# Patient Record
Sex: Male | Born: 1975 | Race: White | Hispanic: No | Smoking: Never smoker
Health system: Southern US, Community
[De-identification: ages and names within clinical notes are randomized; demographics above are authoritative.]

## PROBLEM LIST (undated history)

## (undated) DIAGNOSIS — G8929 Other chronic pain: Secondary | ICD-10-CM

## (undated) DIAGNOSIS — F909 Attention-deficit hyperactivity disorder, unspecified type: Secondary | ICD-10-CM

## (undated) DIAGNOSIS — I10 Essential (primary) hypertension: Secondary | ICD-10-CM

## (undated) DIAGNOSIS — M5030 Other cervical disc degeneration, unspecified cervical region: Secondary | ICD-10-CM

## (undated) HISTORY — PX: ANKLE SURGERY: SHX546

## (undated) HISTORY — PX: EYE SURGERY: SHX253

## (undated) HISTORY — PX: SPINAL CORD STIMULATOR INSERTION: SHX5378

## (undated) HISTORY — PX: FRACTURE SURGERY: SHX138

## (undated) HISTORY — PX: SPINAL CORD STIMULATOR REMOVAL: SHX2423

---

## 2008-04-18 ENCOUNTER — Emergency Department: Payer: Self-pay | Admitting: Emergency Medicine

## 2008-12-10 ENCOUNTER — Encounter: Payer: Self-pay | Admitting: Family Medicine

## 2009-05-06 ENCOUNTER — Emergency Department: Payer: Self-pay | Admitting: Internal Medicine

## 2012-04-24 ENCOUNTER — Emergency Department: Payer: Self-pay | Admitting: Emergency Medicine

## 2014-04-22 ENCOUNTER — Emergency Department: Payer: Self-pay | Admitting: Emergency Medicine

## 2015-04-20 ENCOUNTER — Emergency Department
Admission: EM | Admit: 2015-04-20 | Discharge: 2015-04-20 | Disposition: A | Discharge status: Home / Self Care | Payer: Self-pay | Attending: Emergency Medicine | Admitting: Emergency Medicine

## 2015-04-20 ENCOUNTER — Encounter: Payer: Self-pay | Admitting: Family Medicine

## 2015-04-20 DIAGNOSIS — J039 Acute tonsillitis, unspecified: Secondary | ICD-10-CM

## 2015-04-20 DIAGNOSIS — Z792 Long term (current) use of antibiotics: Secondary | ICD-10-CM | POA: Insufficient documentation

## 2015-04-20 LAB — POCT RAPID STREP A: Streptococcus, Group A Screen (Direct): NEGATIVE

## 2015-04-20 MED ORDER — AMOXICILLIN-POT CLAVULANATE 875-125 MG PO TABS: 1.0000 | ORAL_TABLET | Freq: Two times a day (BID) | ORAL | Status: DC

## 2015-04-20 NOTE — ED Provider Notes (Signed)
[facility] Emergency Department  ____________________________________________  Time seen: Approximately 11:06 AM  I have reviewed the triage vital signs and the nursing notes.   HISTORY  Chief Complaint No chief complaint on file.    HPI Timothy Stallionnthoney R Sobolewski Sr. is a 39 y.o. male with a 7 day history of sore throat. He states that just prior to onset of symptoms, he was working with insulation. He states he was wearing a mask, but doesn't feel it worked very well. He states he had a fever last night. Sore throat was generalized when it started, but is now mainly on the left. He denies difficulty breathing, swallowing, or speaking.   History reviewed. No pertinent past medical history.  There are no active problems to display for this patient.   No past surgical history on file.  Current Outpatient Rx  Name  Route  Sig  Dispense  Refill  . amoxicillin-clavulanate (AUGMENTIN) 875-125 MG per tablet   Oral   Take 1 tablet by mouth 2 (two) times daily.   20 tablet   0     Allergies Review of patient's allergies indicates no known allergies.  History reviewed. No pertinent family history.  Social History History  Substance Use Topics  . Smoking status: Not on file  . Smokeless tobacco: Not on file  . Alcohol Use: Not on file    Review of Systems Constitutional: No fever/chills Eyes: No visual changes. ENT: Sore throat.yes, Difficulty Swallowing no Respiratory: Denies shortness of breath. Gastrointestinal: No abdominal pain.  No nausea, no vomiting.  No diarrhea. Genitourinary: Negative for dysuria. Musculoskeletal: no for generalized body aches. Skin: no for rash. Neurological: Negative for headaches, focal weakness or numbness.  10-point ROS otherwise negative.  ____________________________________________   PHYSICAL EXAM:  VITAL SIGNS: ED Triage Vitals  Enc Vitals Group     BP --      Pulse --      Resp --      Temp --      Temp  src --      SpO2 --      Weight --      Height --      Head Cir --      Peak Flow --      Pain Score --      Pain Loc --      Pain Edu? --      Excl. in GC? --     Constitutional: Alert and oriented. Well appearing and in no acute distress. Eyes: Conjunctivae are normal. PERRL. EOMI. Head: Atraumatic. Nose: No congestion/rhinnorhea. Mouth/Throat: Mucous membranes are moist.  Oropharynx erythematous. No uvular shift. Exudate present on left tonsil. Neck: No stridor.  Lymphatic: no lymphadenopathy. Cardiovascular: Normal rate, regular rhythm. Good peripheral circulation. Respiratory: Normal respiratory effort. Lungs CTAB. Gastrointestinal: Soft and nontender. Musculoskeletal: No lower extremity tenderness nor edema.   Neurologic:  Normal speech and language. No gross focal neurologic deficits are appreciated. Speech is normal. No gait instability. Skin:  Skin is warm, dry and intact. No rash noted Psychiatric: Mood and affect are normal. Speech and behavior are normal.  ____________________________________________   LABS (all labs ordered are listed, but only abnormal results are displayed)  Labs Reviewed  POCT RAPID STREP A ([facility] CARE ONLY)  POCT RAPID STREP A ([facility] CARE ONLY)  Negative for strep. ____________________________________________  EKG  ____________________________________________  RADIOLOGY  ____________________________________________   PROCEDURES  Procedure(s) performed: None  Critical Care performed: No  ____________________________________________   INITIAL IMPRESSION / ASSESSMENT AND PLAN / ED COURSE  Pertinent labs & imaging results that were available during my care of the patient were reviewed by me and considered in my medical decision making (see chart for details).  Rapid strep is negative. Will send for culture. Will prescribe Augmentin due to unilateral pain after 1 week. No evidence of peritonsillar abscess today. Return  precautions advised. Will have follow up with ENT for symptoms that are not improving with medication and time. ____________________________________________   FINAL CLINICAL IMPRESSION(S) / ED DIAGNOSES  Final diagnoses:  Tonsillitis with exudate     Chinita PesterCari B Ami Thornsberry, FNP 04/20/15 1137  Chinita PesterCari B Rody Keadle, FNP 04/20/15 1222  Jene Everyobert Kinner, MD 04/20/15 1525

## 2015-04-20 NOTE — ED Notes (Signed)
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  No resp distress or difficulty talking or swallowing.  Skin w/d

## 2015-04-20 NOTE — Discharge Instructions (Signed)

## 2015-04-22 LAB — CULTURE, GROUP A STREP (THRC)

## 2015-06-21 ENCOUNTER — Emergency Department
Admission: EM | Admit: 2015-06-21 | Discharge: 2015-06-21 | Disposition: A | Discharge status: Home / Self Care | Payer: Self-pay | Attending: Emergency Medicine | Admitting: Emergency Medicine

## 2015-06-21 ENCOUNTER — Emergency Department: Payer: Self-pay

## 2015-06-21 ENCOUNTER — Encounter: Payer: Self-pay | Admitting: Emergency Medicine

## 2015-06-21 DIAGNOSIS — Y998 Other external cause status: Secondary | ICD-10-CM | POA: Insufficient documentation

## 2015-06-21 DIAGNOSIS — S92902A Unspecified fracture of left foot, initial encounter for closed fracture: Secondary | ICD-10-CM

## 2015-06-21 DIAGNOSIS — X58XXXA Exposure to other specified factors, initial encounter: Secondary | ICD-10-CM | POA: Insufficient documentation

## 2015-06-21 DIAGNOSIS — S92212A Displaced fracture of cuboid bone of left foot, initial encounter for closed fracture: Secondary | ICD-10-CM | POA: Insufficient documentation

## 2015-06-21 DIAGNOSIS — Y9289 Other specified places as the place of occurrence of the external cause: Secondary | ICD-10-CM | POA: Insufficient documentation

## 2015-06-21 DIAGNOSIS — Y9389 Activity, other specified: Secondary | ICD-10-CM | POA: Insufficient documentation

## 2015-06-21 MED ORDER — OXYCODONE-ACETAMINOPHEN 5-325 MG PO TABS: 1.0000 | ORAL_TABLET | Freq: Three times a day (TID) | ORAL | Status: DC | PRN

## 2015-06-21 MED ORDER — IBUPROFEN 800 MG PO TABS: 800.0000 mg | ORAL_TABLET | Freq: Three times a day (TID) | ORAL | Status: AC | PRN

## 2015-06-21 NOTE — ED Provider Notes (Signed)
[facility] Emergency Department Provider Note  ____________________________________________  Time seen: Approximately 5:35 PM  I have reviewed the triage vital signs and the nursing notes.   HISTORY  Chief Complaint Ankle Pain   HPI Timothy Stallionnthoney R Moreland Sr. is a 39 y.o. male presents to the ER for complaints of left foot pain. Patient reports that last night he went to stand up and get out of his chair and reports he rolled his left ankle. Patient states that he has had pain in his left foot since. Denies fall or other injury. Denies head injury or loss consciousness.  Patient reports that he had some pain last night but got better after he walked on it. Patient reports that the pain was then worse this morning when he woke up as it had swollen and was bruised. Denies pain radiation. Denies numbness or tingling sensation. Patient states that pain is to the left lateral foot and in the arch area. States the pain is currently 7 out of 10.   No past medical history on file.  There are no active problems to display for this patient.   No past surgical history on file.  Current Outpatient Rx  Name  Route  Sig  Dispense  Refill               Allergies Review of patient's allergies indicates no known allergies.  History reviewed. No pertinent family history.  Social History History  Substance Use Topics  . Smoking status: Never Smoker   . Smokeless tobacco: Not on file  . Alcohol Use: Not on file    Review of Systems Constitutional: No fever/chills Eyes: No visual changes. ENT: No sore throat. Cardiovascular: Denies chest pain. Respiratory: Denies shortness of breath. Gastrointestinal: No abdominal pain.  No nausea, no vomiting.  No diarrhea.  No constipation. Genitourinary: Negative for dysuria. Musculoskeletal: Negative for back pain. Left foot pain.  Skin: Negative for rash. Neurological: Negative for headaches, focal weakness or  numbness.  10-point ROS otherwise negative.  ____________________________________________   PHYSICAL EXAM:  VITAL SIGNS: ED Triage Vitals  Enc Vitals Group     BP 06/21/15 1733 164/89 mmHg     Pulse Rate 06/21/15 1733 89     Resp 06/21/15 1733 16     Temp 06/21/15 1733 98.3 F (36.8 C)     Temp src --      SpO2 06/21/15 1733 97 %     Weight --      Height --      Head Cir --      Peak Flow --      Pain Score 06/21/15 1728 10     Pain Loc --      Pain Edu? --      Excl. in GC? --     Constitutional: Alert and oriented. Well appearing and in no acute distress. Eyes: Conjunctivae are normal. PERRL. EOMI. Head: Atraumatic. Nose: No congestion/rhinnorhea. Mouth/Throat: Mucous membranes are moist.  Oropharynx non-erythematous. Neck: No stridor.  No cervical spine tenderness to palpation. Hematological/Lymphatic/Immunilogical: No cervical lymphadenopathy. Cardiovascular: Normal rate, regular rhythm. Grossly normal heart sounds.  Good peripheral circulation. Respiratory: Normal respiratory effort.  No retractions. Lungs CTAB. Gastrointestinal: Soft and nontender. No distention. No abdominal bruits. No CVA tenderness. Musculoskeletal: No lower or upper extremity tenderness nor edema.  No joint effusions. No cervical, thoracic or lumbar tenderness to palpation. Except: Left lateral and left medial foot moderate tender to palpation with mild to moderate ecchymosis and  mild swelling. Pain with rotation and dorsiflexion. Bilateral pedal pulses equal and easily palpated. No motor or sensation deficit. Neurologic:  Normal speech and language. No gross focal neurologic deficits are appreciated. No gait instability. Skin:  Skin is warm, dry and intact. No rash noted. Psychiatric: Mood and affect are normal. Speech and behavior are normal.  ____________________________________________   LABS (all labs ordered are listed, but only abnormal results are displayed)  Labs Reviewed - No  data to display  RADIOLOGY  I, Renford Dills, personally viewed and evaluated these images as part of my medical decision making.   EXAM: LEFT FOOT - COMPLETE 3+ VIEW  COMPARISON: LEFT ankle radiographs 04/22/2014  FINDINGS: Osseous mineralization normal.  Joint spaces preserved.  No fracture, dislocation, or bone destruction.  IMPRESSION: Normal exam.   Electronically Signed By: Ulyses Southward M.D. On: 06/21/2015 18:16      Xray reviewed, small bony fragment adjacent to left cuboid bone, suspect avulsion fracture left cuboid.   ____________________________________________   PROCEDURES  Procedure(s) performed:  SPLINT APPLICATION Date/Time: 7:00 PM Authorized by: Renford Dills Consent: Verbal consent obtained. Risks and benefits: risks, benefits and alternatives were discussed Consent given by: patient Splint applied by:ed technician Location details: left foot Splint type: posterior splint Supplies used: ocl and crutches Post-procedure: The splinted body part was neurovascularly unchanged following the procedure. Patient tolerance: Patient tolerated the procedure well with no immediate complications.     _____________________   INITIAL IMPRESSION / ASSESSMENT AND PLAN / ED COURSE  Pertinent labs & imaging results that were available during my care of the patient were reviewed by me and considered in my medical decision making (see chart for details).  Very well appearing. No acute distress. Presents to ER for left foot pain post accidentally rolling left ankle last night. Denies other injuries. Left foot small avulsion fracture of cuboid. Will splint, prn ibuprofen and percocet. Follow up with orthopedic this week. Discussed follow and return parameters. Patient agreed to plan.   ____________________________________________   FINAL CLINICAL IMPRESSION(S) / ED DIAGNOSES  Final diagnoses:  Foot fracture, left, closed, initial encounter  Cuboid  fracture, left, closed, initial encounter      Renford Dills, NP 06/21/15 1900  Sharman Cheek, MD 06/21/15 2104

## 2015-06-21 NOTE — Discharge Instructions (Signed)
Take medication as prescribed. Apply ice. Keep in splint and use crutches. No weightbearing.  Follow-up with orthopedics this week. See above to call tomorrow to schedule follow-up. Return to the ER for new or worsening concerns.   Left foot Fracture A fracture is a break in a bone, due to a force on the bone that is greater than the bone's strength can handle. There are many types of fractures, including:  Complete fracture: The break passes completely through the bone.  Displaced: The ends of the bone fragments are not properly aligned.  Non-displaced: The ends of the bone fragments are in proper alignment.  Incomplete fracture (greenstick): The break does not pass completely through the bone. Incomplete fractures may or may not be angular (angulated).  Open fracture (compound): Part of the broken bone pokes through the skin. Open fractures have a high risk for infection.  Closed fracture: The fracture has not broken through the skin.  Comminuted fracture: The bone is broken into more than two pieces.  Compression fracture: The break occurs from extreme pressure on the bone (includes crushing injury).  Impacted fracture: The broken bone ends have been driven into each other.  Avulsion fracture: A ligament or tendon pulls a small piece of bone off from the main bony segment.  Pathologic fracture: A fracture due to the bone being made weak by a disease (osteoporosis or tumors).  Stress fracture: A fracture caused by intense exercise or repetitive and prolonged pressure that makes the bone weak. SYMPTOMS   Pain, tenderness, bleeding, bruising, and swelling at the fracture site.  Weakness and inability to bear weight on the injured extremity.  Paleness and deformity (sometimes).  Loss of pulse, numbness, tingling, or paralysis below the fracture site (usually a limb); these are emergencies. CAUSES  Bone being subjected to a force greater than its strength. RISK INCREASES  WITH:  Contact sports and falls from heights.  Previous or current bone problems (osteoporosis or tumors).  Poor balance.  Poor strength and flexibility. PREVENTION   Warm up and stretch properly before activity.  Maintain physical fitness:  Cardiovascular fitness.  Muscle strength.  Flexibility and endurance.  Wear proper protective equipment.  Use proper exercise technique. RELATED COMPLICATIONS   Bone fails to heal (nonunion).  Bone heals in a poor position (malunion).  Low blood volume (hypovolemic), shock due to blood loss.  Clump of fat cells travels through the blood (fat embolus) from the injury site to the lungs or brain (more common with thigh fractures).  Obstruction of nearby arteries. TREATMENT  Treatment first requires realigning of the bones (reduction) by a medically trained person, if the fracture is displaced. After realignment if the fracture is completed, or for non-displaced fractures, ice and medicine are used to reduce pain and inflammation. The bone and adjacent joints are then restrained with a splint, cast, or brace to allow the bones to heal without moving. Surgery is sometimes needed, to reposition the bones and hold the position with rods, pins, plates, or screws. Restraint for long periods of time may result in muscle and joint weakness or build up of fluid in tissues (edema). For this reason, physical therapy is often needed to regain strength and full range of motion. Recovery is complete when there is no bone motion at the fracture site and x-rays (radiographs) show complete healing.  MEDICATION   General anesthesia, sedation, or muscle relaxants may be needed to allow for realignment of the fracture. If pain medicine is needed, nonsteroidal anti-inflammatory medicines (  aspirin and ibuprofen), or other minor pain relievers (acetaminophen), are often advised.  Do not take pain medicine for 7 days before surgery.  Stronger pain relievers may  be prescribed by your caregiver. Use only as directed and only as much as you need. SEEK MEDICAL CARE IF:   The following occur after restraint or surgery. (Report any of these signs immediately):  Swelling above or below the fracture site.  Severe, persistent pain.  Blue or gray skin below the fracture site, especially under the nails. Numbness or loss of feeling below the fracture site. Document Released: 11/21/2005 Document Revised: 11/07/2012 Document Reviewed: 03/05/2009 [facility] Patient Information 2015 [employer] [place]. This information is not intended to replace advice given to you by your health care provider. Make sure you discuss any questions you have with your health care provider.

## 2015-06-21 NOTE — ED Notes (Signed)
Pt injured left ankle last night after fall. Having swelling to area.

## 2015-06-21 NOTE — ED Notes (Signed)
Left Ankle elevated on pillow and ice pack applied.

## 2016-03-03 ENCOUNTER — Ambulatory Visit (INDEPENDENT_AMBULATORY_CARE_PROVIDER_SITE_OTHER): Payer: Medicaid Other | Admitting: Podiatry

## 2016-03-03 ENCOUNTER — Telehealth: Payer: Self-pay | Admitting: *Deleted

## 2016-03-03 ENCOUNTER — Encounter: Payer: Self-pay | Admitting: Podiatry

## 2016-03-03 ENCOUNTER — Ambulatory Visit (INDEPENDENT_AMBULATORY_CARE_PROVIDER_SITE_OTHER): Payer: Medicaid Other

## 2016-03-03 VITALS — BP 137/85 | HR 108 | Resp 18

## 2016-03-03 DIAGNOSIS — S93492D Sprain of other ligament of left ankle, subsequent encounter: Secondary | ICD-10-CM | POA: Diagnosis not present

## 2016-03-03 DIAGNOSIS — R52 Pain, unspecified: Secondary | ICD-10-CM

## 2016-03-03 DIAGNOSIS — T148 Other injury of unspecified body region: Secondary | ICD-10-CM | POA: Diagnosis not present

## 2016-03-03 DIAGNOSIS — T148XXA Other injury of unspecified body region, initial encounter: Secondary | ICD-10-CM

## 2016-03-03 DIAGNOSIS — S93402S Sprain of unspecified ligament of left ankle, sequela: Secondary | ICD-10-CM

## 2016-03-03 NOTE — Progress Notes (Signed)
   Subjective:    Patient ID: Timothy StallionAnthoney R Dross Sr., male    DOB: Jul 19, 1976, 40 y.o.   MRN: [id number]  HPI  40 year old male presents the office of consent a left ankle pain which is been ongoing for approximately 6-7 months. He said when the pain started he had a injury to his ankle which he rolled his ankle. At that time to the emergency room he was told he had a fracture. He has been following up in the clinic and he has had steroid injections in the area but any relief. He has also had braces without any relief. He feels it is not coming from the bone his morbid tendon or ligament his ankle. He states he has not had pain when he gets up in the morning however after being on his feet for quite some time he gets pain. No tingling or numbness. Other complaints.   Review of Systems  All other systems reviewed and are negative.      Objective:   Physical Exam General: AAO x3, NAD  Dermatological: Skin is warm, dry and supple bilateral. Nails x 10 are well manicured; remaining integument appears unremarkable at this time. There are no open sores, no preulcerative lesions, no rash or signs of infection present.  Vascular: Dorsalis Pedis artery and Posterior Tibial artery pedal pulses are 2/4 bilateral with immedate capillary fill time. Pedal hair growth present. No varicosities and no lower extremity edema present bilateral. There is no pain with calf compression, swelling, warmth, erythema.   Neruologic: Grossly intact via light touch bilateral. Vibratory intact via tuning fork bilateral. Protective threshold with Semmes Wienstein monofilament intact to all pedal sites bilateral. Patellar and Achilles deep tendon reflexes 2+ bilateral. No Babinski or clonus noted bilateral.   Musculoskeletal: There is tenderness palpation of the course the ATFL the left ankle. There is no palmar CFL or PTFL. There is no pain the other ankle ligament. Ankle, subtalar joint range of motion is intact. There is an  increase in anterior drawer test compared to contralateral extremity. No area pinpoint bony tenderness or pain the vibratory sensation. There is localized edema to the anterolateral aspect of the ankle. MMT 5/5.  Gait: Unassisted, Nonantalgic.       Assessment & Plan:   40 year old male left ankle pain, likely ATFL sprain/tear.  -Treatment options discussed including all alternatives, risks, and complications -X-rays were obtained and reviewed with the patient. There is no evidence of acute fracture stress fracture identified this time.  -At this time to think any pain and given clinical symptoms I do recommend an MRI of the left ankle to evaluate for ankle instability, tear. This is for surgical planning.  -Continue ankle brace for now.  -Follow-up after MRI.   Ovid CurdMatthew Wagoner, DPM

## 2016-03-03 NOTE — Telephone Encounter (Addendum)
-----   Message from Vivi BarrackMatthew R Wagoner, DPM sent at 03/03/2016  9:45 AM EDT ----- Can you please order an MRI of the left ankle to rule out ATFL tear; history of ankle sprain and inversion ankle injury. [facility]  03/04/2016-[facility] requires more information, faxed to [phone] Case# [id number], orders, pt demographics and clinicals.  03/07/2016-[facility] PRIOR AUTHORIZED FOR MRI LEFT ANKLE #[id number], VALID 03/04/2016 TO 04/03/2016. [facility]. 03/09/2016-faxed request for copy of MRI left ankle disc.  Left message informing pt of the delay in results for MRI to be sent to overread service, and we would call once results were reviewed by Dr. Ardelle AntonWagoner. Pt called and states he wanted to make sure what my message meant and I explained that Dr. Ardelle AntonWagoner wanted more detailed review of the MRI image and we would call with instruction to continue the prescribed therapies and I would call with results once the overread had been reviewed. 03/11/2016-Mailed MRI copy disc to [facility].

## 2016-03-08 ENCOUNTER — Ambulatory Visit
Admission: RE | Admit: 2016-03-08 | Discharge: 2016-03-08 | Disposition: A | Discharge status: Home / Self Care | Payer: Medicaid Other | Source: Ambulatory Visit | Attending: Podiatry | Admitting: Podiatry

## 2016-03-08 DIAGNOSIS — R52 Pain, unspecified: Secondary | ICD-10-CM | POA: Insufficient documentation

## 2016-03-08 DIAGNOSIS — T148XXA Other injury of unspecified body region, initial encounter: Secondary | ICD-10-CM

## 2016-03-08 DIAGNOSIS — T148 Other injury of unspecified body region: Secondary | ICD-10-CM | POA: Insufficient documentation

## 2016-03-08 DIAGNOSIS — S93402S Sprain of unspecified ligament of left ankle, sequela: Secondary | ICD-10-CM | POA: Diagnosis present

## 2016-03-08 DIAGNOSIS — M25472 Effusion, left ankle: Secondary | ICD-10-CM | POA: Diagnosis not present

## 2016-03-08 DIAGNOSIS — M24272 Disorder of ligament, left ankle: Secondary | ICD-10-CM | POA: Diagnosis not present

## 2016-03-09 NOTE — Telephone Encounter (Signed)
-----   Message from Vivi BarrackMatthew R Wagoner, DPM sent at 03/08/2016  5:05 PM EDT ----- Can you please send his MRI out for an over read to look at the ATFL and the lateral ankle ligaments.

## 2016-03-15 ENCOUNTER — Encounter: Payer: Self-pay | Admitting: Podiatry

## 2016-03-15 ENCOUNTER — Ambulatory Visit (INDEPENDENT_AMBULATORY_CARE_PROVIDER_SITE_OTHER): Payer: Medicaid Other | Admitting: Podiatry

## 2016-03-15 VITALS — BP 116/71 | HR 65 | Resp 18

## 2016-03-15 DIAGNOSIS — T148 Other injury of unspecified body region: Secondary | ICD-10-CM | POA: Diagnosis not present

## 2016-03-15 DIAGNOSIS — T148XXA Other injury of unspecified body region, initial encounter: Secondary | ICD-10-CM | POA: Insufficient documentation

## 2016-03-15 DIAGNOSIS — S93402S Sprain of unspecified ligament of left ankle, sequela: Secondary | ICD-10-CM

## 2016-03-15 MED ORDER — OXYCODONE-ACETAMINOPHEN 5-325 MG PO TABS: 1.0000 | ORAL_TABLET | Freq: Three times a day (TID) | ORAL | Status: DC | PRN

## 2016-03-15 NOTE — Progress Notes (Signed)
Patient ID: Timothy StallionAnthoney R Casanova Sr., male   DOB: [birth year], 40 y.o.   MRN: [id number]  Subjective: 40 year old male presents the office for follow-up evaluation of left ankle pain. He states he is tried with a brace although it throbs and causes discomfort. Otherwise his pain is about the same. He presents to discuss MRI results. No acute changes his last appointment and no other complaints at this time.  Objective: General: AAO x3, NAD  Dermatological: Skin is warm, dry and supple bilateral. Nails x 10 are well manicured; remaining integument appears unremarkable at this time. There are no open sores, no preulcerative lesions, no rash or signs of infection present.  Vascular: Dorsalis Pedis artery and Posterior Tibial artery pedal pulses are 2/4 bilateral with immedate capillary fill time. Pedal hair growth present.  There is no pain with calf compression, swelling, warmth, erythema.   Neruologic: Grossly intact via light touch bilateral. Vibratory intact via tuning fork bilateral. Protective threshold with Semmes Wienstein monofilament intact to all pedal sites bilateral. Patellar and Achilles deep tendon reflexes 2+ bilateral. No Babinski or clonus noted bilateral.   Musculoskeletal: There is tenderness palpation of the course the ATFL the left ankle. There is no pain along the CFL or PTFL. There is no pain the other ankle ligament. Ankle, subtalar joint range of motion is intact. There is an increase in anterior drawer test compared to contralateral extremity. No area pinpoint bony tenderness or pain the vibratory sensation. There is localized edema to the anterolateral aspect of the ankle. MMT 5/5. Overall exam appears to be unchanged.  Gait: Unassisted, Nonantalgic.   Assessment:  40 year old male left ankle pain, likely ATFL sprain/tear.  Plan:  -Treatment options discussed including all alternatives, risks, and complications -MRI results were discussed the patient. Ice and his MRI for  second opinion. -Dispensed anklet to help with swelling as well as for some stability. -Ice and elevation -Prescribed Percocet. He states he has had like and previously which he did not do well with. He said Percocet previously without any problems. The nausea is experiencing previously was needed to not take pain medicine with food. -We will call him with results of the MRI second opinion and for appointment at that time.   IMPRESSION: 1. The calcaneofibular ligament is slightly indistinct which could be result from a prior sprain, but is not thought to be overtly discontinuous. Talofibular ligaments appear intact. I do not see discontinuity of the anterior inferior tibiofibular ligaments to suggest syndesmotic injury. 2. Distal tibialis posterior tendinopathy, correlate clinically in assessing for tibialis posterior dysfunction. 3. Very subtle edema signal along the anterior margin of the distal Achilles tendon, but without tendinopathy or overt preAchilles bursitis.  Ovid CurdMatthew Benaiah Behan, DPM

## 2016-03-29 ENCOUNTER — Encounter: Payer: Self-pay | Admitting: Podiatry

## 2016-03-29 ENCOUNTER — Ambulatory Visit (INDEPENDENT_AMBULATORY_CARE_PROVIDER_SITE_OTHER): Payer: Medicaid Other | Admitting: Podiatry

## 2016-03-29 VITALS — BP 118/75 | HR 76 | Resp 18

## 2016-03-29 DIAGNOSIS — T148XXA Other injury of unspecified body region, initial encounter: Secondary | ICD-10-CM

## 2016-03-29 DIAGNOSIS — T148 Other injury of unspecified body region: Secondary | ICD-10-CM

## 2016-03-29 DIAGNOSIS — S93492D Sprain of other ligament of left ankle, subsequent encounter: Secondary | ICD-10-CM | POA: Diagnosis not present

## 2016-03-29 DIAGNOSIS — S93402S Sprain of unspecified ligament of left ankle, sequela: Secondary | ICD-10-CM

## 2016-03-29 MED ORDER — OXYCODONE-ACETAMINOPHEN 10-325 MG PO TABS: 1.0000 | ORAL_TABLET | Freq: Three times a day (TID) | ORAL | Status: DC | PRN

## 2016-03-29 NOTE — Progress Notes (Signed)
Patient ID: Beatriz StallionAnthoney R Shisler Sr., male   DOB: March 02, 1976, 40 y.o.   MRN: [id number]  Subjective: 40 year old male presents the office for follow-up evaluation of left ankle pain. His pain is about the same as last appointment. The percocet does help. No acute changes since last appointment. No acute changes his last appointment and no other complaints at this time.  Objective: General: AAO x3, NAD  Dermatological: Skin is warm, dry and supple bilateral. Nails x 10 are well manicured; remaining integument appears unremarkable at this time. There are no open sores, no preulcerative lesions, no rash or signs of infection present.  Vascular: Dorsalis Pedis artery and Posterior Tibial artery pedal pulses are 2/4 bilateral with immedate capillary fill time. Pedal hair growth present.  There is no pain with calf compression, swelling, warmth, erythema.   Neruologic: Grossly intact via light touch bilateral. Vibratory intact via tuning fork bilateral. Protective threshold with Semmes Wienstein monofilament intact to all pedal sites bilateral. Patellar and Achilles deep tendon reflexes 2+ bilateral. No Babinski or clonus noted bilateral.   Musculoskeletal: There is tenderness palpation of the course the ATFL the left ankle. There is no pain along the CFL or PTFL. There is no pain on the other ankle ligament. Ankle, subtalar joint range of motion is intact. There is mild tenderness over the fibula but no pain to vibratory sensation.There is localized edema to the anterolateral aspect of the ankle but this has improved. MMT 5/5. Overall exam appears to be unchanged.  Gait: Unassisted, Nonantalgic.   Assessment:  40 year old male left ankle pain, bone bruise/contusion  Plan:  -Treatment options discussed including all alternatives, risks, and complications -MRI overreamed was discussed with the patient. Reveals that the ATFL is intact. Given his continued symptoms recommend physical therapy. In the  meantime he does have a cam boot at home and recommended to use this. Refilled Percocet today. Ice the area. Pain medicine as needed which is refilled. -Follow-up after PT or sooner if any issues arise.   IMPRESSION: 1. The calcaneofibular ligament is slightly indistinct which could be result from a prior sprain, but is not thought to be overtly discontinuous. Talofibular ligaments appear intact. I do not see discontinuity of the anterior inferior tibiofibular ligaments to suggest syndesmotic injury. 2. Distal tibialis posterior tendinopathy, correlate clinically in assessing for tibialis posterior dysfunction. 3. Very subtle edema signal along the anterior margin of the distal Achilles tendon, but without tendinopathy or overt preAchilles Bursitis.  MRI over read: 1. Subtle bone marrow edema involving the lateral aspect of the lateral Miles with adjacent soft tissue edema compatible mild bone contusion or stress reaction 2. Slight fraying. Peroneus brevis tendon just distal to the lateral malleolus with minimal adjacent soft tissue edema. (HE HAS NO PAIN TO THIS AREA)  Ovid CurdMatthew Kasidi Shanker, DPM

## 2016-04-05 ENCOUNTER — Encounter: Payer: Self-pay | Admitting: Podiatry

## 2016-04-20 ENCOUNTER — Telehealth: Payer: Self-pay | Admitting: Podiatry

## 2016-04-20 NOTE — Telephone Encounter (Signed)
OK to refill- can come by Thursday to pick up

## 2016-04-20 NOTE — Telephone Encounter (Signed)
Pt called in to see about getting a refill on rx he couldn't get in to see the PT until next month

## 2016-04-20 NOTE — Telephone Encounter (Addendum)
Pt wants refill of Oxycodone til he gets to PT.  04/21/2016-Left message informing pt he would be able to pick up the rx in the [place] office today.

## 2016-04-25 ENCOUNTER — Telehealth: Payer: Self-pay | Admitting: Podiatry

## 2016-04-25 NOTE — Telephone Encounter (Signed)
Pt to stop by [place] office 5.23 to pick up rx.

## 2016-04-25 NOTE — Telephone Encounter (Signed)
OK to refill- not sure if it was ever printed last week. If not please refill. I can do it tomorrow when I am there as well.

## 2016-04-26 ENCOUNTER — Other Ambulatory Visit: Payer: Self-pay | Admitting: Podiatry

## 2016-04-26 MED ORDER — OXYCODONE-ACETAMINOPHEN 10-325 MG PO TABS: 1.0000 | ORAL_TABLET | Freq: Three times a day (TID) | ORAL | Status: DC | PRN

## 2016-04-26 NOTE — Telephone Encounter (Signed)
Patient came into the [place] office today and got a refill of the RX. Misty StanleyLisa

## 2016-05-16 ENCOUNTER — Telehealth: Payer: Self-pay | Admitting: Podiatry

## 2016-05-16 NOTE — Telephone Encounter (Signed)
Patient called said that Dr. Ardelle AntonWagoner ordered PT for him with [facility] and he was scheduled to go there today for PT however, they do not take his insurance ( medicaid) . Patient needs to be referred somewhere that does take Medicaid. Also, said that he needs a refill on his pain meds that he uses as a sleep aid. Please call patient.

## 2016-05-16 NOTE — Telephone Encounter (Signed)
OK to refill- please let him know about medicaid coverage for PT. We can try for him to do them himself or go at least once if they will do that to get a good home treatment.

## 2016-05-17 ENCOUNTER — Other Ambulatory Visit: Payer: Self-pay | Admitting: Podiatry

## 2016-05-17 MED ORDER — OXYCODONE-ACETAMINOPHEN 10-325 MG PO TABS: 1.0000 | ORAL_TABLET | Freq: Three times a day (TID) | ORAL | Status: DC | PRN

## 2016-05-17 NOTE — Progress Notes (Signed)
SCHEDULED FOR APPOINTMENT  °

## 2016-05-17 NOTE — Telephone Encounter (Signed)
Informed pt that Dr. Ardelle AntonWagoner, refilled the Percocet 10/325.  Pt states his ankle still swells and hurt and he thinks he would like an appt. Transferred to schedulers.

## 2016-05-17 NOTE — Addendum Note (Signed)
Addended by: Alphia Kava'CONNELL, VALERY D on: 05/17/2016 11:57 AM   Modules accepted: Orders

## 2016-05-17 NOTE — Telephone Encounter (Signed)
Can you let him know that he can come by and pick up his rx- also, please schedule him an appointment if he does not have one. Thanks

## 2016-06-14 ENCOUNTER — Ambulatory Visit (INDEPENDENT_AMBULATORY_CARE_PROVIDER_SITE_OTHER): Payer: Medicaid Other | Admitting: Podiatry

## 2016-06-14 DIAGNOSIS — G8929 Other chronic pain: Secondary | ICD-10-CM

## 2016-06-14 DIAGNOSIS — S93492S Sprain of other ligament of left ankle, sequela: Secondary | ICD-10-CM

## 2016-06-14 DIAGNOSIS — M25572 Pain in left ankle and joints of left foot: Secondary | ICD-10-CM

## 2016-06-14 DIAGNOSIS — S93402S Sprain of unspecified ligament of left ankle, sequela: Secondary | ICD-10-CM

## 2016-06-14 MED ORDER — OXYCODONE-ACETAMINOPHEN 10-325 MG PO TABS: 1.0000 | ORAL_TABLET | Freq: Three times a day (TID) | ORAL | Status: DC | PRN

## 2016-06-14 MED ORDER — METHYLPREDNISOLONE 4 MG PO TBPK: ORAL_TABLET | ORAL | Status: DC

## 2016-06-19 NOTE — Progress Notes (Signed)
Patient ID: Timothy StallionAnthoney R Agostino Sr., Timothy Mullen   DOB: 1976-10-28, 40 y.o.   MRN: [id number]  Subjective: 40 year old Timothy Mullen presents the office for follow-up evaluation of left ankle pain. Since last appointment he has and wearing the cam boot intermittently which seems to help some but he discontinued have symptoms. Given his insurance is unable to complete physical therapy. He does take pain medicine at night as needed and is asking for refill today. Overall he feels that his symptoms are about the same. No acute changes since last appointment and no new concerns today.  Objective: General: AAO x3, NAD  Dermatological: Skin is warm, dry and supple bilateral. Nails x 10 are well manicured; remaining integument appears unremarkable at this time. There are no open sores, no preulcerative lesions, no rash or signs of infection present.  Vascular: Dorsalis Pedis artery and Posterior Tibial artery pedal pulses are 2/4 bilateral with immedate capillary fill time. Pedal hair growth present.  There is no pain with calf compression, swelling, warmth, erythema.   Neruologic: Grossly intact via light touch bilateral. Vibratory intact via tuning fork bilateral. Protective threshold with Semmes Wienstein monofilament intact to all pedal sites bilateral. Patellar and Achilles deep tendon reflexes 2+ bilateral. No Babinski or clonus noted bilateral.   Musculoskeletal: There iscontinued tenderness palpation of the course the ATFL the left ankle. There is no pain along the CFL or PTFL. There is no pain on the other ankle ligament. Ankle, subtalar joint range of motion is intact. There is no tenderness over the fibula but no pain to vibratory sensation.There is localized edema to the anterolateral aspect of the ankle without any assessing erythema or increase in warmth there does appear to be slight positive anterior drawer of the left side compared to the contralateral extremity. However there is no gross instability noted.Marland Kitchen. MMT  5/5.   Gait: Unassisted, Nonantalgic.   Assessment: 40 year old Timothy Mullen left ankle pain, bone bruise/contusion  Plan:  -Treatment options discussed including all alternatives, risks, and complications -I long discussion the patient in regards to both conservative and surgical treatment options. At this point is his been ongoing for quite some time. Although the MRI does not show any  discrete tear of the ankle ligaments he does have tenderness directly along the course the ATFL. I discussed possible surgical intervention to repair this ligament as it maybe scarred or attenuated. He likely proceed with surgical intervention however he would hold off on that at this time. For now continue with home rehabilitation exercises. Refill pain medicine today as needed. Ice to the area. Limited exercise.   IMPRESSION: 1. The calcaneofibular ligament is slightly indistinct which could be result from a prior sprain, but is not thought to be overtly discontinuous. Talofibular ligaments appear intact. I do not see discontinuity of the anterior inferior tibiofibular ligaments to suggest syndesmotic injury. 2. Distal tibialis posterior tendinopathy, correlate clinically in assessing for tibialis posterior dysfunction. 3. Very subtle edema signal along the anterior margin of the distal Achilles tendon, but without tendinopathy or overt preAchilles Bursitis.  MRI over read: 1. Subtle bone marrow edema involving the lateral aspect of the lateral Miles with adjacent soft tissue edema compatible mild bone contusion or stress reaction 2. Slight fraying. Peroneus brevis tendon just distal to the lateral malleolus with minimal adjacent soft tissue edema. (HE HAS NO PAIN TO THIS AREA)  Timothy Mullen, DPM

## 2016-07-01 ENCOUNTER — Telehealth: Payer: Self-pay | Admitting: Podiatry

## 2016-07-01 NOTE — Telephone Encounter (Addendum)
Pt called [name] requesting refill Oxycodone.  07/31/20170-Unable to leave message for pt to tell him the Oxycodone would be mailed to his home address.

## 2016-07-01 NOTE — Telephone Encounter (Signed)
Pt called in requesting refill for oxyCODONE

## 2016-07-01 NOTE — Telephone Encounter (Signed)
OK 

## 2016-07-04 ENCOUNTER — Telehealth: Payer: Self-pay | Admitting: *Deleted

## 2016-07-04 MED ORDER — OXYCODONE-ACETAMINOPHEN 5-325 MG PO TABS: 1.0000 | ORAL_TABLET | Freq: Three times a day (TID) | ORAL | 0 refills | Status: DC | PRN

## 2016-07-04 MED ORDER — OXYCODONE-ACETAMINOPHEN 10-325 MG PO TABS: 1.0000 | ORAL_TABLET | Freq: Three times a day (TID) | ORAL | 0 refills | Status: DC | PRN

## 2016-07-04 NOTE — Addendum Note (Signed)
Addended by: Alphia Kava D on: 07/04/2016 01:11 PM   Modules accepted: Orders

## 2016-07-04 NOTE — Telephone Encounter (Signed)
Entered in error

## 2016-07-20 ENCOUNTER — Telehealth: Payer: Self-pay | Admitting: Podiatry

## 2016-07-20 NOTE — Telephone Encounter (Signed)
Patient called wants a refill on his pain meds. Says he will run out before his next appt with Dr. Ardelle AntonWagoner next Tuesday.

## 2016-07-20 NOTE — Telephone Encounter (Addendum)
Pt wants refill of his pain medication will run out before next appt with Dr. Ardelle AntonWagoner. 07/21/2016-Left message informing pt Dr. Ardelle AntonWagoner is in [place] today and he can pick up his rx there.

## 2016-07-21 ENCOUNTER — Other Ambulatory Visit: Payer: Self-pay | Admitting: Podiatry

## 2016-07-21 ENCOUNTER — Other Ambulatory Visit: Payer: Self-pay | Admitting: *Deleted

## 2016-07-21 MED ORDER — OXYCODONE-ACETAMINOPHEN 10-325 MG PO TABS: 1.0000 | ORAL_TABLET | Freq: Three times a day (TID) | ORAL | 0 refills | Status: DC | PRN

## 2016-07-21 NOTE — Telephone Encounter (Signed)
OK to refill Misty StanleyLisa- can you print out and have it refilled. [facility] [place] patient.

## 2016-07-21 NOTE — Telephone Encounter (Signed)
Patient came by and a prescription was written and dispensed per Dr Ardelle AntonWagoner. Misty StanleyLisa

## 2016-07-26 ENCOUNTER — Encounter: Payer: Self-pay | Admitting: Podiatry

## 2016-07-26 ENCOUNTER — Ambulatory Visit (INDEPENDENT_AMBULATORY_CARE_PROVIDER_SITE_OTHER): Payer: Medicaid Other | Admitting: Podiatry

## 2016-07-26 DIAGNOSIS — S93492D Sprain of other ligament of left ankle, subsequent encounter: Secondary | ICD-10-CM

## 2016-07-26 DIAGNOSIS — M25572 Pain in left ankle and joints of left foot: Secondary | ICD-10-CM

## 2016-07-26 DIAGNOSIS — S93402S Sprain of unspecified ligament of left ankle, sequela: Secondary | ICD-10-CM

## 2016-07-26 DIAGNOSIS — G8929 Other chronic pain: Secondary | ICD-10-CM

## 2016-07-26 NOTE — Patient Instructions (Signed)

## 2016-07-31 NOTE — Progress Notes (Signed)
Patient ID: Timothy Mullen., male   DOB: [birth year], 40 y.o.   MRN: [id number]  Subjective: 40 year old male presents the office for follow-up evaluation of left ankle pain. He points the anterolateral aspect of the ankle he states he continues to get pain. He has to get [place] area at times. The majority pain with standing and walking all day. He does take pain is intermittently which does help. He has had multiple conservative treatments this time including injections, home rehabilitation exercises, shoe changes. Feels able to do physical therapy due to insurance. At this time he would discuss possible surgical intervention. No other complaints at this time and no new concerns.  Objective: General: AAO x3, NAD  Dermatological: Skin is warm, dry and supple bilateral. Nails x 10 are well manicured; remaining integument appears unremarkable at this time. There are no open sores, no preulcerative lesions, no rash or signs of infection present.  Vascular: Dorsalis Pedis artery and Posterior Tibial artery pedal pulses are 2/4 bilateral with immedate capillary fill time. Pedal hair growth present.  There is no pain with calf compression, swelling, warmth, erythema.   Neruologic: Grossly intact via light touch bilateral. Vibratory intact via tuning fork bilateral. Protective threshold with Semmes Wienstein monofilament intact to all pedal sites bilateral.  Musculoskeletal: There is continued tenderness palpation of the course the ATFL of the left ankle. There is no pain along the CFL or PTFL. There is no pain on the other ankle ligament. Ankle, subtalar joint range of motion is intact. There is no tenderness over the fibula but no pain to vibratory sensation.There is localized edema to the anterolateral aspect of the ankle without any assessing erythema or increase in warmth there does appear to be slight positive anterior drawer of the left side compared to the contralateral extremity. However there  is no gross instability noted.Marland Kitchen MMT 5/5. Overall exam is unchanged.  Gait: Unassisted, Nonantalgic.   Assessment: 40 year old male left ankle pain, bone bruise/contusion  Plan:  -Treatment options discussed including all alternatives, risks, and complications -I discussed both conservative and surgical treatment options. This time he said multiple conservative treatments and he has had no resolution of symptoms. At this time a discussed some surgical intervention. Although the MRI does not necessarily state much about the ATFL this is with the majority of his symptoms are localized. I discussed with him ATFL repair and ankle stabilization given the continued pain. I discussed with him in detail given the MRI findings as well as that surgery may not relieve his symptoms and he understands this and wishes to proceed. He states he did feel better going ahead proceed with surgery is at this point he has had no relief despite other treatment. -The incision placement as well as the postoperative course was discussed with the patient. I discussed risks of the surgery which include, but not limited to, infection, bleeding, pain, swelling, need for further surgery, delayed or nonhealing, painful or ugly scar, numbness or sensation changes, over/under correction, recurrence, transfer lesions, further deformity, hardware failure, DVT/PE, loss of toe/foot. Patient understands these risks and wishes to proceed with surgery. The surgical consent was reviewed with the patient all 3 pages were signed. No promises or guarantees were given to the outcome of the procedure. All questions were answered to the best of my ability. Before the surgery the patient was encouraged to call the office if there is any further questions. The surgery will be performed at the [facility] on an outpatient basis.  IMPRESSION: 1. The calcaneofibular ligament is slightly indistinct which could be result from a prior sprain, but is not thought  to be overtly discontinuous. Talofibular ligaments appear intact. I do not see discontinuity of the anterior inferior tibiofibular ligaments to suggest syndesmotic injury. 2. Distal tibialis posterior tendinopathy, correlate clinically in assessing for tibialis posterior dysfunction. 3. Very subtle edema signal along the anterior margin of the distal Achilles tendon, but without tendinopathy or overt preAchilles Bursitis.  MRI over read: 1. Subtle bone marrow edema involving the lateral aspect of the lateral Miles with adjacent soft tissue edema compatible mild bone contusion or stress reaction 2. Slight fraying. Peroneus brevis tendon just distal to the lateral malleolus with minimal adjacent soft tissue edema. (HE HAS NO PAIN TO THIS AREA)  Ovid CurdMatthew Marrah Vanevery, DPM

## 2016-08-01 ENCOUNTER — Telehealth: Payer: Self-pay | Admitting: *Deleted

## 2016-08-01 NOTE — Telephone Encounter (Signed)
"  I was calling to schedule surgery with Dr. Ardelle AntonWagoner."

## 2016-08-03 NOTE — Telephone Encounter (Addendum)
Pt called stating he was to get a rub from a pharmacy in [place].  I reviewed the 07/26/2016 and the order was not in the PLAN. I told pt I would ask Dr. Ardelle AntonWagoner and get the order on the way. 08/04/2016-DrArdelle Anton. Wagoner ordered [facility] antiinflammatory cream.

## 2016-08-03 NOTE — Telephone Encounter (Signed)
Can do the anti-inflammatory one but I didn't know if he could get it because of medicaid.

## 2016-08-03 NOTE — Telephone Encounter (Signed)
I'm returning your call.  You want to schedule surgery.  "Yes, he said someone would call to schedule it.  I was trying to wait."  Do you have a date in mind that you would like to do it?  "I'd like to do it as soon as possible.  He can do it on September 20.  "That date will be fine.  He was supposed to have given me a prescription for a topical cream and I haven't gotten it."  I'll transfer you to the nurse and she take care of this for you.  "Okay, thanks so much for your help."

## 2016-08-04 ENCOUNTER — Telehealth: Payer: Self-pay | Admitting: *Deleted

## 2016-08-04 MED ORDER — OXYCODONE-ACETAMINOPHEN 10-325 MG PO TABS: 1.0000 | ORAL_TABLET | Freq: Three times a day (TID) | ORAL | 0 refills | Status: DC | PRN

## 2016-08-04 MED ORDER — NONFORMULARY OR COMPOUNDED ITEM: 3 refills | Status: DC

## 2016-08-04 NOTE — Telephone Encounter (Signed)
Per Dr Noe GensWagoner-stated to prescribe RX for patient and patient will be coming by to pick up today. Misty StanleyLisa

## 2016-08-04 NOTE — Addendum Note (Signed)
Addended by: Hadley PenOX, Jenai Scaletta R on: 08/04/2016 08:03 AM   Modules accepted: Orders

## 2016-08-16 ENCOUNTER — Telehealth: Payer: Self-pay | Admitting: Podiatry

## 2016-08-16 ENCOUNTER — Other Ambulatory Visit: Payer: Self-pay

## 2016-08-16 MED ORDER — OXYCODONE-ACETAMINOPHEN 10-325 MG PO TABS: 1.0000 | ORAL_TABLET | Freq: Three times a day (TID) | ORAL | 0 refills | Status: DC | PRN

## 2016-08-16 NOTE — Telephone Encounter (Signed)
OK to refill but only give 20

## 2016-08-16 NOTE — Telephone Encounter (Signed)
Per Lahoma CrockerJessica Q called pt to let him know his Rx will be ready [place] tomorrow Wednesday 13 September.

## 2016-08-16 NOTE — Telephone Encounter (Signed)
Patient called requesting a refill of his pain medicine.

## 2016-08-17 ENCOUNTER — Telehealth: Payer: Self-pay

## 2016-08-24 ENCOUNTER — Encounter: Payer: Self-pay | Admitting: Podiatry

## 2016-08-24 DIAGNOSIS — T148 Other injury of unspecified body region: Secondary | ICD-10-CM | POA: Diagnosis not present

## 2016-08-24 DIAGNOSIS — S93492S Sprain of other ligament of left ankle, sequela: Secondary | ICD-10-CM | POA: Diagnosis not present

## 2016-08-26 ENCOUNTER — Telehealth: Payer: Self-pay | Admitting: *Deleted

## 2016-08-26 DIAGNOSIS — T148XXA Other injury of unspecified body region, initial encounter: Secondary | ICD-10-CM

## 2016-08-26 DIAGNOSIS — Z9889 Other specified postprocedural states: Secondary | ICD-10-CM

## 2016-08-26 NOTE — Progress Notes (Signed)
DOS 09.20.2017 Left Ankle Stabilization (Repair of Outside Ankle Ligaments) and Exploration/Repair of Perneal Tendons (Tendons on Outside of Ankle)

## 2016-08-26 NOTE — Telephone Encounter (Addendum)
Pt states his foot is killing him after his surgery, the pain medication is not working. I was unable to understand pt's phone number so left message on home line. Dr. Ardelle AntonWagoner states pt can take the Ibuprofen 800mg  tid between doses and take two of the Dilaudid 2mg . Informed pt, pt states he has taken 2 dilaudid at a time without relief.  Dr. Ardelle AntonWagoner states he will have Dr. Logan BoresEvans write for Percocet 10/325 to [facility] once pt has turn the Dilaudid pills over to Dr. Logan BoresEvans.  Informed pt and he states understanding and will have his wife, Roney MansJennifer Smith, pick up the Percocet rx in [place]. I called pt and left message reminding him to have his wife bring the Dilaudid to give to Dr. Logan BoresEvans, [facility]. 08/26/2016-DrLogan Bores. Evans called states pt has not come to the office for the rx and it is 5:00pm, I told him, that was fine he said he had a few Percocet left. 09/13/2016-Pt states he doesn't like running out of his Percocet between the appts and would like the amount ordered to be increased. 09/14/2016-DrArdelle Anton. Wagoner asked to check how pt is taking the Percocet.  Pt states he is taking 2 Percocet 3x a day. Informed pt Dr.Wagoner increased the amount dispensed of Percocet 10/325mg  to #50 1-2 tablets every 6 hours, and to take the Motrin as directed in between dosing of Percocet. Pt states he is taking the Motrin 600mg  as ordered as well. Pt states the incision has drained some but not a lot and the other sutures are still in place. Pt states he would like to pick the [place] in [place] today, and will see us in the [place] office tomorrow. 10/17/2016-Pt called request refill of the Oxycodone last filled 10/06/2016, and state PT due to his will not see him since he is over 21yo. Left message informing pt he could pick up the [place]ycodone in the [place] office. I also asked pt to call with the name of the PT facility he was told he could not have PT, and to call me back, because Medicaid  would cover the PT within certain number of days post op. 10/19/2016-I called [facility], the receptionist told me that even though pt is within the 90 day post op period that Medicaid would cover, they are only certified for 38 yo and less for Medicaid covered PT, that the hospital is usually certified for 21yo and older. Left message informing pt of [facility] Medicaid criteria, and to call to inform me which hospital he would like his PT, because they are routinely certified for Medicaid covered 21yo and greater. 10/20/2016-I spoke with Charmise - [facility] she states they are certified to see 21yo and older post op pts, they evaluate with the one PT session, then send there PT clinicals to Medicaid for pre-cert for more visits. Faxed referral, pt's demographics and LOV to [facility]. Informed pt he would be scheduling with [facility], gave the contact number [phone]/06/2016-Pt request refill of oxycodone. Dr. Bary CastillaWagoner okayed to be refilled as previously and referral to pain management. Pasty ArchJ. Smith states pt called for pain medication and I told her to tell him he could pick it up in the [place] office between 4:30pm and 5:00pm. 12/07/2016-Left message informing pt Dr. Ardelle AntonWagoner was referring him to pain management - Dr. Metta Clinesrisp [place] and due to his insurance his referral would need to come from his PCP. I requested he call with PCP and phone.  12/09/2016-Medicaid denied [place] patch coverage.

## 2016-08-30 ENCOUNTER — Ambulatory Visit (INDEPENDENT_AMBULATORY_CARE_PROVIDER_SITE_OTHER): Payer: Medicaid Other | Admitting: Podiatry

## 2016-08-30 ENCOUNTER — Encounter: Payer: Self-pay | Admitting: Podiatry

## 2016-08-30 DIAGNOSIS — G8929 Other chronic pain: Secondary | ICD-10-CM

## 2016-08-30 DIAGNOSIS — M25572 Pain in left ankle and joints of left foot: Secondary | ICD-10-CM

## 2016-08-30 DIAGNOSIS — S93402S Sprain of unspecified ligament of left ankle, sequela: Secondary | ICD-10-CM

## 2016-08-30 DIAGNOSIS — Z9889 Other specified postprocedural states: Secondary | ICD-10-CM

## 2016-08-30 MED ORDER — CEPHALEXIN 500 MG PO CAPS: 500.0000 mg | ORAL_CAPSULE | Freq: Three times a day (TID) | ORAL | 1 refills | Status: DC

## 2016-08-30 MED ORDER — OXYCODONE-ACETAMINOPHEN 10-325 MG PO TABS: 1.0000 | ORAL_TABLET | Freq: Four times a day (QID) | ORAL | 0 refills | Status: DC | PRN

## 2016-08-31 NOTE — Progress Notes (Signed)
Subjective: Timothy MemsAnthoney R Seidel Sr. is a 40 y.o. is seen today in office s/p left lateral ankle stabilization, peroneal tendon repair preformed last week. He states that he is having pains through did change the bandage. He has noticed a small amount of drainage coming from the incision but this has decreased. His continue the antibiotics. Upon change the bandage she denies any redness, drainage. Overall his pain has improved however he is requesting a refill the Percocet not to take the dilaudid.  Denies any systemic complaints such as fevers, chills, nausea, vomiting. No calf pain, chest pain, shortness of breath.   Objective: General: No acute distress, AAOx3  DP/PT pulses palpable 2/4, CRT < 3 sec to all digits.  Protective sensation intact. Motor function intact.  Left ankle: Incision is well coapted without any evidence of dehiscence and sutures and staples are intact. There is no surrounding erythema, ascending cellulitis, fluctuance, crepitus, malodor, drainage/purulence. There is minimal edema around the surgical site. There is mild pain along the surgical site.  No other areas of tenderness to bilateral lower extremities.  No other open lesions or pre-ulcerative lesions.  No pain with calf compression, swelling, warmth, erythema.   Assessment and Plan:  Status post left ankle surgery, doing well with no complications   -Treatment options discussed including all alternatives, risks, and complications -Bandage was removed today. Unable to express any drainage coming from the incision. Small amount of bloody drainage at this identified on the bandage. However given that he states that he is having some drainage we will continue antibiotic for 1 more week. Refilled Keflex today. Continue to monitor his other symptoms of infection. -Continue cam boot. Continue nonweightbearing at all times. Muscular cam boot even at night while sleeping. -Ice/elevation -Pain medication as needed. Refilled  Percocet 10/325. -Monitor for any clinical signs or symptoms of infection and DVT/PE and directed to call the office immediately should any occur or go to the ER. -Follow-up in 1 week or sooner if any problems arise. In the meantime, encouraged to call the office with any questions, concerns, change in symptoms.   Ovid CurdMatthew Wagoner, DPM

## 2016-09-01 ENCOUNTER — Encounter: Payer: Self-pay | Admitting: Podiatry

## 2016-09-06 ENCOUNTER — Ambulatory Visit (INDEPENDENT_AMBULATORY_CARE_PROVIDER_SITE_OTHER): Payer: Medicaid Other | Admitting: Podiatry

## 2016-09-06 DIAGNOSIS — Z9889 Other specified postprocedural states: Secondary | ICD-10-CM

## 2016-09-06 MED ORDER — IBUPROFEN 600 MG PO TABS: 600.0000 mg | ORAL_TABLET | Freq: Three times a day (TID) | ORAL | 2 refills | Status: DC | PRN

## 2016-09-06 MED ORDER — OXYCODONE-ACETAMINOPHEN 10-325 MG PO TABS: 1.0000 | ORAL_TABLET | Freq: Four times a day (QID) | ORAL | 0 refills | Status: DC | PRN

## 2016-09-06 MED ORDER — CLINDAMYCIN HCL 300 MG PO CAPS: 300.0000 mg | ORAL_CAPSULE | Freq: Three times a day (TID) | ORAL | 1 refills | Status: DC

## 2016-09-08 ENCOUNTER — Encounter: Payer: Self-pay | Admitting: Podiatry

## 2016-09-14 ENCOUNTER — Other Ambulatory Visit: Payer: Self-pay | Admitting: Podiatry

## 2016-09-14 MED ORDER — OXYCODONE-ACETAMINOPHEN 10-325 MG PO TABS: ORAL_TABLET | ORAL | 0 refills | Status: DC

## 2016-09-14 NOTE — Telephone Encounter (Signed)
OK to refill and dispense 50 but hopefully he will start to decrease the amount he is taking. He should take ibuprofen/motin in between as well.   How does the incision look?

## 2016-09-14 NOTE — Telephone Encounter (Signed)
Please ask how many he is taking at a time and how often so we can adequately see what he is taking

## 2016-09-15 ENCOUNTER — Ambulatory Visit (INDEPENDENT_AMBULATORY_CARE_PROVIDER_SITE_OTHER): Payer: Medicaid Other | Admitting: Podiatry

## 2016-09-15 ENCOUNTER — Encounter: Payer: Self-pay | Admitting: Podiatry

## 2016-09-15 DIAGNOSIS — Z9889 Other specified postprocedural states: Secondary | ICD-10-CM

## 2016-09-15 DIAGNOSIS — T148XXA Other injury of unspecified body region, initial encounter: Secondary | ICD-10-CM

## 2016-09-15 NOTE — Progress Notes (Signed)
  Subjective: Ardelia MemsAnthoney R Mendosa Sr. is a 40 y.o. is seen today in office s/p left lateral ankle stabilization, peroneal tendon repair preformed last week. Presents today for follow-up evaluation of left ankles surgery. He states the pain is controlled but he is asking for refill of the medicine as the medicine does help. He is tried of remaining nonweightbearing as much as possible. He discontinued the cam boot.  Denies any systemic complaints such as fevers, chills, nausea, vomiting. No calf pain, chest pain, shortness of breath.   Objective: General: No acute distress, AAOx3  DP/PT pulses palpable 2/4, CRT < 3 sec to all digits.  Protective sensation intact. Motor function intact.  Left ankle: Incision is well coapted without any evidence of dehiscence and sutures and staples are half intact and the others have been removed previously. There is no surrounding erythema, ascending cellulitis, fluctuance, crepitus, malodor, drainage/purulence. Unable to express any fluid today. There is minimal edema around the areas appears to be improved compared to last appointment. There is continued mild pain along the surgical site. No other open lesions or pre-ulcerative lesions.  No pain with calf compression, swelling, warmth, erythema.   Assessment and Plan:  Status post left ankle surgery  -Treatment options discussed including all alternatives, risks, and complications -Some more of the sutures were removed. I removed some more this enters today but left the proximal sutures intact. There is no gapping the incision. Antibiotic ointment was applied followed by a bandage. Continue with dressing changes at home as well. Finish course of antibiotics. Continue cam boot and nonweightbearing at all times. Mild placeholder symptoms of infection: The office initiate any occur. Refilled pain medicine see him back as scheduled or sooner if needed. Call any questions or concerns meantime.  Prescription for the taper  new cam boot at his request.  Ovid CurdMatthew Wagoner, DPM

## 2016-09-15 NOTE — Progress Notes (Signed)
Subjective: Timothy MemsAnthoney R Eddington Sr. is a 40 y.o. is seen today in office s/p left lateral ankle stabilization, peroneal tendon repair preformed last week. He presents today for concerns of some redness around the incision. He is also has a small amount of clear drainage coming from the incision but denies any pus. Denies any red streaks. He discontinued taking pain medicine on a regular basis. He has entrance the office versus possible his been in a cam boot. He is been using crutches.  Denies any systemic complaints such as fevers, chills, nausea, vomiting. No calf pain, chest pain, shortness of breath.   Objective: General: No acute distress, AAOx3  DP/PT pulses palpable 2/4, CRT < 3 sec to all digits.  Protective sensation intact. Motor function intact.  Left ankle: Incision is well coapted without any evidence of dehiscence and sutures and staples are intact. There is faint amount of erythema around the incisional any ascending synovitis. I'm unable to express any drainage or pus today from the incision. There is localized edema around the incision but no other areas of swelling. Moderate tenderness palpation of the surgical site. No other areas of tenderness. No other areas of tenderness to bilateral lower extremities.  No other open lesions or pre-ulcerative lesions.  No pain with calf compression, swelling, warmth, erythema.   Assessment and Plan:  Status post left ankle surgery, mild erythema   -Treatment options discussed including all alternatives, risks, and complications -Part of the sutures removed today. There was no significant gapping incision and after removal of the sutures there is no drainage or pus expressed. Antibiotic was applied followed by a bandage. The dressing clean, dry, intact. He has been changing the bandages at home himself. Prescribed Keflex. Monitor for any signs or symptoms of worsening infection, the office or go to the ER should any occur. Continue pain medication  as needed. Continue nonweightbearing at all times in CAM boot. Follow-up in 1 week or sooner if needed. Call any questions or concerns.  Ovid CurdMatthew Gretel Cantu, DPM

## 2016-09-23 ENCOUNTER — Encounter: Payer: Self-pay | Admitting: Podiatry

## 2016-09-23 ENCOUNTER — Ambulatory Visit (INDEPENDENT_AMBULATORY_CARE_PROVIDER_SITE_OTHER): Payer: Medicaid Other | Admitting: Podiatry

## 2016-09-23 VITALS — BP 129/92 | HR 78 | Temp 97.6°F

## 2016-09-23 DIAGNOSIS — Z9889 Other specified postprocedural states: Secondary | ICD-10-CM

## 2016-09-23 DIAGNOSIS — T148XXA Other injury of unspecified body region, initial encounter: Secondary | ICD-10-CM

## 2016-09-23 MED ORDER — CEPHALEXIN 500 MG PO CAPS: 500.0000 mg | ORAL_CAPSULE | Freq: Three times a day (TID) | ORAL | 1 refills | Status: DC

## 2016-09-23 MED ORDER — OXYCODONE-ACETAMINOPHEN 10-325 MG PO TABS: 1.0000 | ORAL_TABLET | Freq: Four times a day (QID) | ORAL | 0 refills | Status: DC | PRN

## 2016-09-23 MED ORDER — MUPIROCIN 2 % EX OINT
1.0000 | TOPICAL_OINTMENT | Freq: Two times a day (BID) | CUTANEOUS | 0 refills | Status: DC
Start: 2016-09-23 — End: 2016-11-24

## 2016-09-26 NOTE — Progress Notes (Signed)
  Subjective: Ardelia MemsAnthoney R Morrissette Sr. is a 40 y.o. is seen today in office s/p left lateral ankle stabilization, peroneal tendon repair. He states that he is doing better. Diagnosis small amount of drainage from the incision. He still has some tenderness on the incision. This continue cam boot. He does not keep bandage on the incision at all times be does go for long periods without a bandage. Denies any systemic complaints such as fevers, chills, nausea, vomiting. No calf pain, chest pain, shortness of breath.   Objective: General: No acute distress, AAOx3  DP/PT pulses palpable 2/4, CRT < 3 sec to all digits.  Protective sensation intact. Motor function intact.  Left ankle: Incision is well coapted without any evidence of dehiscence on the proximal aspect of the incision. Sutures remain intact the proximal aspect. On the distal portion where I removed the sutures/staples early there appears to be some overlapping of the incision however the underlying incision appears to be healed. There is very faint amount of erythema around the incision how this appears be more of a allergic type reaction. He has been using Neosporin. I am unable to express any drainage or pus today. There is minimal edema to the area. Mild to palpation of the surgical site. No other areas of tenderness. No other open lesions or pre-ulcerative lesions.  No pain with calf compression, swelling, warmth, erythema.   Assessment and Plan:  Status post left ankle surgery  -Treatment options discussed including all alternatives, risks, and complications -Remainder of the sutures removed today. Bactroban ointment was ordered today. Achilles tendon and somewhat of an allergic reaction to the Neosporin. Also continue antibiotics for now as a constant however does not to be overtly infected. -Continue cam boot. He started transition to weightbearing as tolerated is he is able. -Discussed with him that if the incision has not healing better  on the distal portion discussed with them scar revision however we'll hold off on that for now and ensure that infection is not present. -Pain medicine refilled today. Refilled Percocet. -Ice and elevation. -Follow-up in 2 weeks or sooner if needed. Call any questions or concerns meantime.  Ovid CurdMatthew Andrell Bergeson, DPM

## 2016-09-29 ENCOUNTER — Encounter: Payer: Self-pay | Admitting: Podiatry

## 2016-09-29 ENCOUNTER — Other Ambulatory Visit: Payer: Self-pay | Admitting: *Deleted

## 2016-09-29 ENCOUNTER — Ambulatory Visit (INDEPENDENT_AMBULATORY_CARE_PROVIDER_SITE_OTHER): Payer: Medicaid Other | Admitting: Podiatry

## 2016-09-29 VITALS — BP 120/82 | HR 83 | Temp 97.9°F | Resp 16

## 2016-09-29 DIAGNOSIS — T148XXA Other injury of unspecified body region, initial encounter: Secondary | ICD-10-CM

## 2016-09-29 DIAGNOSIS — R21 Rash and other nonspecific skin eruption: Secondary | ICD-10-CM

## 2016-09-29 DIAGNOSIS — Z9889 Other specified postprocedural states: Secondary | ICD-10-CM

## 2016-09-29 MED ORDER — OXYCODONE-ACETAMINOPHEN 10-325 MG PO TABS: 1.0000 | ORAL_TABLET | Freq: Four times a day (QID) | ORAL | 0 refills | Status: DC | PRN

## 2016-09-29 MED ORDER — DESOXIMETASONE 0.25 % EX CREA: 1.0000 "application " | TOPICAL_CREAM | Freq: Two times a day (BID) | CUTANEOUS | 0 refills | Status: DC

## 2016-09-30 ENCOUNTER — Ambulatory Visit: Payer: Medicaid Other | Admitting: Podiatry

## 2016-10-06 ENCOUNTER — Telehealth: Payer: Self-pay

## 2016-10-06 ENCOUNTER — Encounter: Payer: Self-pay | Admitting: Podiatry

## 2016-10-06 ENCOUNTER — Ambulatory Visit (INDEPENDENT_AMBULATORY_CARE_PROVIDER_SITE_OTHER): Payer: Medicaid Other | Admitting: Podiatry

## 2016-10-06 DIAGNOSIS — Z9889 Other specified postprocedural states: Secondary | ICD-10-CM

## 2016-10-06 DIAGNOSIS — T148XXA Other injury of unspecified body region, initial encounter: Secondary | ICD-10-CM

## 2016-10-06 MED ORDER — OXYCODONE-ACETAMINOPHEN 10-325 MG PO TABS: 1.0000 | ORAL_TABLET | Freq: Four times a day (QID) | ORAL | 0 refills | Status: DC | PRN

## 2016-10-06 MED ORDER — IBUPROFEN 600 MG PO TABS: 600.0000 mg | ORAL_TABLET | Freq: Three times a day (TID) | ORAL | 2 refills | Status: DC | PRN

## 2016-10-09 NOTE — Progress Notes (Signed)
  Subjective: Timothy Mullen. is a 40 y.o. is seen today in office s/p left lateral ankle stabilization, peroneal tendon repair. He states he is having a rash around the incision which is been getting worse. He gets some stinging pain around the incision is well. The small amount of clear bloody drainage from the incision but denies any pus. Denies any increase in swelling or any red streaks. Denies any systemic complaints such as fevers, chills, nausea, vomiting. No calf pain, chest pain, shortness of breath.   Objective: General: No acute distress, AAOx3  DP/PT pulses palpable 2/4, CRT < 3 sec to all digits.  Protective sensation intact. Motor function intact.  Left ankle: Incision is well coapted without any evidence of dehiscence on the proximal aspect of the incision on the distal aspect there is a very superficial opening and I am unable to express any drainage or pus today. There does appear to be an erythematous rash type lesion around the incision with a small amount of blistering. It does not appear to be infection as opposed to an allergy. He is been using antibiotic ointment along the incision. The area of erythema appears to correlate directly round a bandage. There is continued tenderness palpation along the surgical site however somewhat improved subjectively. There is no gross ankle instability and ankle continues to be stable. No other open lesions or pre-ulcerative lesions.  No pain with calf compression, swelling, warmth, erythema.   Assessment and Plan:  Status post left ankle surgery with likely allergic reaction.  -Treatment options discussed including all alternatives, risks, and complications -Order Topicort for around the incision. Hold off on any antibiotic ointment. Finish course of antibiotics. Monitor for any signs or symptoms of infection. Continue daily dressing changes. Continue cam boot, weightbearing as tolerated at this point. Refilled Percocet today. Continue  ice and elevation. Follow-up in 1 week or sooner if indicated. Discussed the possible surgical intervention to for wound debridement, scar modification however we will hold off on that at this point given the rash.  Ovid CurdMatthew Wagoner, DPM

## 2016-10-13 NOTE — Progress Notes (Signed)
  Subjective: Timothy MemsAnthoney R Samano Sr. is a 40 y.o. is seen today in office s/p left lateral ankle stabilization, peroneal tendon repair. He states that he feels that he is improving. He presents today wearing a regular shoe. Easily incision is doing much better. He has small amount of clear drainage coming from the end of the incision at denies any pus. The rash around the area is also improving he denies any increase in swelling or any increase in redness or any red streaks. Denies any systemic complaints such as fevers, chills, nausea, vomiting. No calf pain, chest pain, shortness of breath.   Objective: General: No acute distress, AAOx3  DP/PT pulses palpable 2/4, CRT < 3 sec to all digits.  Protective sensation intact. Motor function intact.  Left ankle: Incision is well coapted without any evidence of dehiscence on the proximal aspect of the incision on the distal aspect there is a very superficial opening and I am unable to express any drainage or pus today. This area continues to improve. The underlying skin appears to be intact and this superficial opening was cemented there was a scab and I debrided this today in the office. Small erythematous rash type lesion on the incision appears to be improving again this correlates directly over the area where there is a bandage is in the shape of a rectangle-type rash. There is no ascending cellulitis. Mild continued tenderness to palpation of the surgical site and is on the course the ATFL. No other open lesions or pre-ulcerative lesions.  No pain with calf compression, swelling, warmth, erythema.   Assessment and Plan:  Status post left ankle surgery improved symptoms  -Treatment options discussed including all alternatives, risks, and complications -Iodosorb was applied to the area of opening followed by dressing. Continue small amount of antibiotic ointment to the area daily until the areas healed. We will also start physical therapy at this time and  a prescription was provided for him today. Concerta transition. She was tolerating for which she is artery been doing. Refill pain medicines as well as ibuprofen. Continue ice and elevation. Follow-up in 23 weeks or sooner if indicated. We will hold off on further surgical intervention at this time as the wound appears to be healing in symptoms are improving.  Ovid CurdMatthew Bethzaida Mullen, DPM

## 2016-10-18 MED ORDER — OXYCODONE-ACETAMINOPHEN 10-325 MG PO TABS: 1.0000 | ORAL_TABLET | Freq: Four times a day (QID) | ORAL | 0 refills | Status: DC | PRN

## 2016-10-18 NOTE — Telephone Encounter (Signed)
Sure! I'll refill as soon as he gets here.

## 2016-10-18 NOTE — Telephone Encounter (Signed)
Please refill. And he had surgery, I thought medicaid would cover PT if he had surgery during the postop timeframe?

## 2016-10-20 NOTE — Telephone Encounter (Signed)
Yes, elevate and treat. S/p lateral ankle stabilization and peroneal repair; work on strengthening and general rehab.

## 2016-10-31 ENCOUNTER — Ambulatory Visit (INDEPENDENT_AMBULATORY_CARE_PROVIDER_SITE_OTHER): Payer: Medicaid Other | Admitting: Podiatry

## 2016-10-31 ENCOUNTER — Encounter: Payer: Self-pay | Admitting: Podiatry

## 2016-10-31 DIAGNOSIS — T148XXA Other injury of unspecified body region, initial encounter: Secondary | ICD-10-CM

## 2016-10-31 DIAGNOSIS — Z9889 Other specified postprocedural states: Secondary | ICD-10-CM

## 2016-10-31 MED ORDER — OXYCODONE-ACETAMINOPHEN 10-325 MG PO TABS: 1.0000 | ORAL_TABLET | Freq: Four times a day (QID) | ORAL | 0 refills | Status: DC | PRN

## 2016-11-01 ENCOUNTER — Ambulatory Visit: Payer: Medicaid Other | Attending: Podiatry | Admitting: Physical Therapy

## 2016-11-01 ENCOUNTER — Encounter: Payer: Self-pay | Admitting: Physical Therapy

## 2016-11-01 DIAGNOSIS — R262 Difficulty in walking, not elsewhere classified: Secondary | ICD-10-CM | POA: Insufficient documentation

## 2016-11-01 DIAGNOSIS — M6281 Muscle weakness (generalized): Secondary | ICD-10-CM | POA: Diagnosis not present

## 2016-11-01 NOTE — Patient Instructions (Addendum)
Dorsiflexion: Resisted    Facing anchor, tubing around left foot, pull toward face.  Repeat ___10_ times per set. Do __2__ sets per session. Do 2___ sessions per day.  http://orth.exer.[email or web address]   Copyright  VHI. All rights reserved.  Eversion: Resisted    With right foot in tubing loop, hold tubing around other foot to resist and turn foot out. Repeat __10__ times per set. Do _2___ sets per session. Do __2__ sessions per day.  http://orth.exer.[email or web address]   Copyright  VHI. All rights reserved.  Plantar Flexion: Resisted    Anchor behind, tubing around left foot, press down. Repeat _10___ times per set. Do _2___ sets per session. Do __2__ sessions per day.  http://orth.exer.[email or web address]   Copyright  VHI. All rights reserved.  Ankle Alphabet    Using left ankle and foot only, trace the letters of the alphabet. Perform A to Z. Repeat __10__ times per set. Do _2___ sets per session. Do 2____ sessions per day.  http://orth.exer.us/16   Copyright  VHI. All rights reserved.  Ankle Circles    Slowly rotate right foot and ankle clockwise then counterclockwise. Gradually increase range of motion. Avoid pain. Circle _10___ times each direction per set. Do __2__ sets per session. Do _2___ sessions per day.  http://[email or web address].exer.[email or web address]   Copyright  [employer]HI. All rights reserved.

## 2016-11-01 NOTE — Progress Notes (Signed)
  Subjective: Timothy MemsAnthoney R Kleckner Sr. is a 40 y.o. is seen today in office s/p left lateral ankle stabilization, peroneal tendon repair. He states he is doing better. The incision is closed in a scab is formed. The red biopsy was having has resolved. He has not had any redness or warmth around the incision. He exhibits some swelling in the area feels tight. He did not get the steroid cream however the area didn't resolve so he did not follow-up on that. He has returned to regular shoe. 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 He has not yesterday physical therapy. Denies any systemic complaints such as fevers, chills, nausea, vomiting. No calf pain, chest pain, shortness of breath.   Objective: General: No acute distress, AAOx3  DP/PT pulses palpable 2/4, CRT < 3 sec to all digits.  Protective sensation intact. Motor function intact.  Left ankle: Incision is well-healed the scar overlying the area. Due to severe to be some mild edema on surgical site but there is no erythema or increase in warmth. There is no gross instability the ankle and appears to be stable. The range of motion has improved some since returning to regular shoe. There is no other areas of tenderness. Majority tenderness on the incision appears to be on the ATFL site. No other open lesions or pre-ulcerative lesions.  No pain with calf compression, swelling, warmth, erythema.   Assessment and Plan:  Status post left ankle surgery improving  -Treatment options discussed including all alternatives, risks, and complications -Ascending incision appears well-healed. There is no clinical signs of infection. He can continue with regular shoe gear. He has an ankle brace that he wears as needed. He starts physical therapy tomorrow. -Pain medication refilled today. Refilled the percocet 10/325 today however discussed with him that at next appointment or refill we will go to 5/325. If he is staying at this  dose of pain medicine on when to refer him to pain management. He's been taking quite a bit of Percocet before the surgery and postoperatively as well. -We will hold further ibuprofen or anti-inflammatories due to worsening acid reflux. Denies any bleeding or stomach upset.   Ovid CurdMatthew Wagoner, DPM  -Iodosorb was applied to the area of opening followed by dressing. Continue small amount of antibiotic ointment to the area daily until the areas healed. We will also start physical therapy at this time and a prescription was provided for him today. Concerta transition. She was tolerating for which she is artery been doing. Refill pain medicines as well as ibuprofen. Continue ice and elevation. Follow-up in 23 weeks or sooner if indicated. We will hold off on further surgical intervention at this time as the wound appears to be healing in symptoms are improving.  Ovid CurdMatthew Wagoner, DPM

## 2016-11-01 NOTE — Therapy (Signed)
[facility] [address] [place], [place], [address] Phone: [phone]   Fax:  [phone]  Physical Therapy Evaluation  Patient Details  Name: Timothy Memsnthoney R Eager Sr. MRN: [id number] Date of Birth: 1976-03-13 Referring Provider: Ardelle AntonWagoner MD  Encounter Date: 11/01/2016      PT End of Session - 11/01/16 1708    Visit Number 1   Number of Visits 12   Date for PT Re-Evaluation 12/13/16   Authorization Type medicaid   PT Start Time 1600   PT Stop Time 1650   PT Time Calculation (min) 50 min   Activity Tolerance Patient tolerated treatment well   Behavior During Therapy [facility] for tasks assessed/performed      History reviewed. No pertinent past medical history.  History reviewed. No pertinent surgical history.  There were no vitals filed for this visit.       Subjective Assessment - 11/01/16 1603    Subjective 40 yo Male s/p LLE ankle stabilization with peronal tendon repair in August 24, 2016. Patient reports that he has just started walking on the left foot in the last week; Patient reports that he is still having a lot of stiffness and pain; He reports that the pain is worse at night. He reports having throbbing/burning pain; He denies any numbness/tingling; He reports getting "knots" in bottom of foot and he has to stop and sit down to relax; Patient is full weight bearing on LLE: He is not using crutches anymore;    Pertinent History personal factors affecting rehab: chronic pain   Limitations Standing;Walking   How long can you stand comfortably? 10-15 min   How long can you walk comfortably? walking is not as bad but then feels more pain afterwards; reports having pain after walking about 50 feet;    Patient Stated Goals relieve pain, stand more, walk better; be able to run again if needed;    Currently in Pain? Yes   Pain Score 7    Pain Location Ankle   Pain Orientation Left   Pain Descriptors / Indicators  Throbbing;Tightness   Pain Type Chronic pain;Surgical pain   Pain Onset More than a month ago   Pain Frequency Constant   Aggravating Factors  standing, walking   Pain Relieving Factors ice helps some, pain medicine some;    Effect of Pain on Daily Activities decreased activity; not working right now;             [facility] - 11/01/16 0001      Assessment   Medical Diagnosis s/p left ankle stabilization with peroneal repair   Referring Provider Wagoner MD   Onset Date/Surgical Date 08/24/16   Hand Dominance Left   Next MD Visit next month   Prior Therapy denies any PT for this condition;      Precautions   Precautions None     Restrictions   Weight Bearing Restrictions No   Other Position/Activity Restrictions not working, take it ease, limit lifting;      Balance Screen   Has the patient fallen in the past 6 months No   Has the patient had a decrease in activity level because of a fear of falling?  No   Is the patient reluctant to leave their home because of a fear of falling?  No     Home Environment   Additional Comments lives in mobile home wiht 6 steps to enter; has bilateral rails; lives with son;  Prior Function   Level of Independence Independent;Independent with gait   Vocation Full time employment  Education officer, environmental; off work right now; supervising;    [employer] stand >8 hours, get up/down from floor; lift 20+ pounds   Leisure everything; ride motorcycle;      Cognition   Overall Cognitive Status Within Functional Limits for tasks assessed     Observation/Other Assessments   Observations has redness along left ankle which looks like possible allergic reaction or razor burn from surgery;   Lower Extremity Functional Scale  9/80 (The lower the score the greater the disability)     Circumferential Edema   Circumferential - Right 24  alone medial malleolus;    Circumferential - Left  28  cm at medial malleolus     Figure 8 Edema    Figure 8 - Right  54.5  cm on RLE    Figure 8 - Left  57  cm on LLE     Sensation   Additional Comments light touch and deep pressure is intact;      Coordination   Gross Motor Movements are Fluid and Coordinated Yes   Fine Motor Movements are Fluid and Coordinated Yes     AROM   Overall AROM Comments BUE and BLE WFL except left ankle   Right Ankle Dorsiflexion 15   Right Ankle Plantar Flexion 65   Right Ankle Inversion 35   Right Ankle Eversion 20   Left Ankle Dorsiflexion -2   Left Ankle Plantar Flexion 55   Left Ankle Inversion 15   Left Ankle Eversion 8     Strength   Overall Strength Comments BUE and BLE is WFL with exception of left ankle;    Right Ankle Dorsiflexion 5/5   Right Ankle Plantar Flexion 5/5   Right Ankle Inversion 5/5   Right Ankle Eversion 5/5   Left Ankle Dorsiflexion 3/5   Left Ankle Plantar Flexion 3/5   Left Ankle Inversion 3-/5   Left Ankle Eversion 3-/5     Palpation   Palpation comment reports moderate tenderness to left ankle along incision site; demonstrates decreased skin mobility due to myofascial tightness;      Transfers   Comments able to transfer sit<>Stand without pushing on chair, independently;      Ambulation/Gait   Gait Comments ambulates with slight antalgic gait pattern; slightly slower gait speed;      Standardized Balance Assessment   Five times sit to stand comments  15.4 sec without pushing on chair (>10 sec indicates increased risk for falls )   10 Meter Walk 1.02 m/s without AD (     High Level Balance   High Level Balance Comments SLS: R: >10 sec, LLE: 3 sec with increased discomfort;         TREATMENT: Initiated HEP with instruction in LLE ankle ROM and strengthening exercise. See patient instructions x4 min;                   PT Education - 11/01/16 1705    Education provided Yes   Education Details HEP initiated, ankle ROM/massage techniques, plan of care   Person(s) Educated Patient    Methods Explanation;Demonstration   Comprehension Verbalized understanding;Returned demonstration;Verbal cues required             PT Long Term Goals - 11/01/16 1713      PT LONG TERM GOAL #1   Title Patient will be independent in home exercise program to improve strength/mobility  for better functional independence with ADLs.   Baseline does not have a home exercise program   Time 6   Period Weeks   Status New     PT LONG TERM GOAL #2   Title  Patient (< 74 years old) will complete five times sit to stand test in < 10 seconds indicating an increased LE strength and improved balance.   Baseline 15.4 sec    Time 6   Period Weeks   Status New     PT LONG TERM GOAL #3   Title Patient will increase lower extremity functional scale to >60/80 to demonstrate improved functional mobility and increased tolerance with ADLs.    Baseline 9/80   Time 6   Period Weeks   Status New     PT LONG TERM GOAL #4   Title Patient will increase LLE anke AROM: DF: 8 degrees, IV: 25 degrees, EV: 10 degrees to improve functional ROM for foot clearance with walking.   Baseline LLE AROM: DF: -2, PF: 65, IV: 15, EV: 8   Time 6   Period Weeks   Status New     PT LONG TERM GOAL #5   Title Patient will increase BLE ankle gross strength to 4+/5 as to improve functional strength for independent gait, increased standing tolerance and increased ADL ability.   Baseline LLE: ankle strength grossly 3- to 3/5   Time 6   Period Weeks   Status New     Additional Long Term Goals   Additional Long Term Goals Yes     PT LONG TERM GOAL #6   Title Patient will report a worst pain of 3/10 on VAS in  left ankle    to improve tolerance with ADLs and reduced symptoms with activities.    Baseline 6-7/10 left ankle throbbing pain   Time 6   Period Weeks   Status New               Plan - 11/01/16 1709    Clinical Impression Statement 40 yo Male presents to therapy s/p LLE ankle stabilization surgery with  peroneal tendon repair. He reports that he has been walking without the boot for past week or so. He reports no limitations other than to take it easy per MD request. Patient demonstrates decreased LLE ankle ROM, increased swelling, weakness and tightness along incision. He also demonstrates impaired balance on LLE being able to only maintain 3 sec of SLS. Patient would benefit from skilled PT Intervention to improve LLE ankle ROM, strengthening, reduce swelling and return to PLOF.    Rehab Potential Good   Clinical Impairments Affecting Rehab Potential positive: young in age, minimal co-morbidites; negative: chronic condition; Patient's clinical presentation is stable;    PT Frequency 2x / week   PT Duration 6 weeks   PT Treatment/Interventions Cryotherapy;Electrical Stimulation;Gait training;Ultrasound;Moist Heat;Stair training;Functional mobility training;Therapeutic activities;Therapeutic exercise;Balance training;Neuromuscular re-education;Manual techniques;Patient/family education;Scar mobilization;Passive range of motion;Dry needling;Taping   PT Next Visit Plan advance HEP, manual therapy   PT Home Exercise Plan initiated- see patient instructions   Consulted and Agree with Plan of Care Patient      Patient will benefit from skilled therapeutic intervention in order to improve the following deficits and impairments:  Pain, Decreased scar mobility, Decreased mobility, Decreased activity tolerance, Decreased strength, Decreased range of motion, Impaired flexibility, Increased edema, Difficulty walking, Decreased balance  Visit Diagnosis: Muscle weakness (generalized) - Plan: PT plan of care cert/re-cert  Difficulty in walking, not elsewhere classified -  Plan: PT plan of care cert/re-cert     Problem List Patient Active Problem List   Diagnosis Date Noted  . Ligament tear 03/15/2016    Saroya Riccobono PT, DPT 11/01/2016, 5:17 PM  [facility] MA[facility]  [facility] [address] , [place], [address] Phone: [phone]   Fax:  [phone]  Name: Timothy Memsnthoney R Rivet Sr. MRN: [id number] Date of Birth: [birth year]

## 2016-11-14 ENCOUNTER — Telehealth: Payer: Self-pay | Admitting: Podiatry

## 2016-11-14 NOTE — Telephone Encounter (Addendum)
Informed pt of Dr. Gabriel RungWagoner's statement and pt states [place] pickup [place]. Pt states medicaid only approved 3 days of PT. I told pt that I would check to see if anyone else knew more about the PT and Medicaid.

## 2016-11-14 NOTE — Telephone Encounter (Signed)
Patient called for a refill on his Percodet 10 mlg. Is out of these. [place]. Last refill was about 2 wks ago for 50 tablets. He takes about 6 a day. Needs more.

## 2016-11-14 NOTE — Telephone Encounter (Signed)
OK to fill but please do Percocet 5/325 1-2 tabs PO q6 hours. Disp 30. I would like to try to decrease the amount of pain medication he is taking.   Dr. Logan BoresEvans- can you please write for this in [place] on Tuesday for him to come get? Thank you.

## 2016-11-15 ENCOUNTER — Other Ambulatory Visit: Payer: Self-pay | Admitting: *Deleted

## 2016-11-15 ENCOUNTER — Telehealth: Payer: Self-pay | Admitting: Podiatry

## 2016-11-15 MED ORDER — OXYCODONE-ACETAMINOPHEN 5-325 MG PO TABS: 1.0000 | ORAL_TABLET | Freq: Four times a day (QID) | ORAL | 0 refills | Status: DC | PRN

## 2016-11-15 NOTE — Telephone Encounter (Signed)
I have had other medicaid patients go to PT for longer post-op. Is there anywhere else that accepts medicaid?

## 2016-11-15 NOTE — Telephone Encounter (Signed)
Complete

## 2016-11-16 ENCOUNTER — Encounter: Payer: Self-pay | Admitting: Physical Therapy

## 2016-11-16 ENCOUNTER — Ambulatory Visit: Payer: Medicaid Other | Attending: Podiatry | Admitting: Physical Therapy

## 2016-11-16 DIAGNOSIS — M6281 Muscle weakness (generalized): Secondary | ICD-10-CM

## 2016-11-16 DIAGNOSIS — R262 Difficulty in walking, not elsewhere classified: Secondary | ICD-10-CM | POA: Diagnosis present

## 2016-11-16 NOTE — Therapy (Signed)
[facility] MAIN [facility] SERVICES [address] [place], [place], [address] Phone: [phone]   Fax:  [phone]  Physical Therapy Treatment  Patient Details  Name: Timothy Mullen Sr. MRN: [id number] Date of Birth: 12-12-1975 Referring Provider: Ardelle Anton MD  Encounter Date: 11/16/2016      PT End of Session - 11/16/16 1810    Visit Number 2   Number of Visits 12   Date for PT Re-Evaluation 12/13/16   Authorization Type medicaid, 3 visits approved through jan 14   PT Start Time 1622   PT Stop Time 1700   PT Time Calculation (min) 38 min   Activity Tolerance Patient limited by pain   Behavior During Therapy [facility] for tasks assessed/performed      History reviewed. No pertinent past medical history.  History reviewed. No pertinent surgical history.  There were no vitals filed for this visit.      Subjective Assessment - 11/16/16 1632    Subjective Patient reports continued popping in left ankle, but reports compliance with HEP; He reports that he feels as though he can go farther with exercise.    Pertinent History personal factors affecting rehab: chronic pain   Limitations Standing;Walking   How long can you stand comfortably? 10-15 min   How long can you walk comfortably? walking is not as bad but then feels more pain afterwards; reports having pain after walking about 50 feet;    Patient Stated Goals relieve pain, stand more, walk better; be able to run again if needed;    Currently in Pain? Yes   Pain Score 7    Pain Location Ankle   Pain Orientation Left   Pain Descriptors / Indicators Tightness;Throbbing   Pain Type Chronic pain;Surgical pain   Pain Onset More than a month ago            [facility] PT Assessment - 11/16/16 0001      AROM   Left Ankle Dorsiflexion -2   Left Ankle Plantar Flexion 60   Left Ankle Inversion 20   Left Ankle Eversion 10       TREATMENT: Long sitting ankle DF stretch with towel 10 sec  hold x2; Long sitting ankle DF with toe extension stretch 10 sec x2;  Red tband ankle DF, PF, EV x10 each with min VCs to avoid hip/knee movement for better ankle strengthening;  Advanced HEP with instruction in LLE ankle stretch: Heel off step stretch 15 sec hold x1; Calf stretch against wall 15 sec hold x1; Calf stretch with knee flexion (soleus stretch) 15 sec hold x1; Pt required min Vcs for positioning to improve stretch;  Standing with red tband around both ankles: Hip abduction x10 bilaterally; Hip extension x10 bilaterally with min Vcs to avoid knee flexion  Side stepping 10 feet x2 laps; Diagonal steps with red tband around knees x10 feet x2 laps forward/backward; Patient required min-moderate verbal/tactile cues for correct exercise technique including positioning for better strengthening;  Forward/backward walking x10 feet x3 laps; Seated with BAPs board, LLE level 3, DF/PF, IV/EV, x10 each;  Patient required min-moderate verbal/tactile cues for correct exercise technique.                        PT Education - 11/16/16 1810    Education provided Yes   Education Details HEP advanced,    Person(s) Educated Patient   Methods Explanation;Verbal cues   Comprehension Verbalized understanding;Returned demonstration;Verbal cues required  PT Long Term Goals - 11/01/16 1713      PT LONG TERM GOAL #1   Title Patient will be independent in home exercise program to improve strength/mobility for better functional independence with ADLs.   Baseline does not have a home exercise program   Time 6   Period Weeks   Status New     PT LONG TERM GOAL #2   Title  Patient (< 38 years old) will complete five times sit to stand test in < 10 seconds indicating an increased LE strength and improved balance.   Baseline 15.4 sec    Time 6   Period Weeks   Status New     PT LONG TERM GOAL #3   Title Patient will increase lower extremity functional  scale to >60/80 to demonstrate improved functional mobility and increased tolerance with ADLs.    Baseline 9/80   Time 6   Period Weeks   Status New     PT LONG TERM GOAL #4   Title Patient will increase LLE anke AROM: DF: 8 degrees, IV: 25 degrees, EV: 10 degrees to improve functional ROM for foot clearance with walking.   Baseline LLE AROM: DF: -2, PF: 65, IV: 15, EV: 8   Time 6   Period Weeks   Status New     PT LONG TERM GOAL #5   Title Patient will increase BLE ankle gross strength to 4+/5 as to improve functional strength for independent gait, increased standing tolerance and increased ADL ability.   Baseline LLE: ankle strength grossly 3- to 3/5   Time 6   Period Weeks   Status New     Additional Long Term Goals   Additional Long Term Goals Yes     PT LONG TERM GOAL #6   Title Patient will report a worst pain of 3/10 on VAS in  left ankle    to improve tolerance with ADLs and reduced symptoms with activities.    Baseline 6-7/10 left ankle throbbing pain   Time 6   Period Weeks   Status New               Plan - 11/16/16 1811    Clinical Impression Statement Patient instructed in advanced LLE ankle ROM/strengthening exercise. Advanced HEP with instruction in advanced strengthening including increased weight bearing towards LLE to improve stance control; Patient reports increased burning discomfort with ankle ROM exercise. He would benefit from additional skilled PT intervention to improve ROM/strength and reduce pain with ADLs;    Rehab Potential Good   Clinical Impairments Affecting Rehab Potential positive: young in age, minimal co-morbidites; negative: chronic condition; Patient's clinical presentation is stable;    PT Frequency 2x / week   PT Duration 6 weeks   PT Treatment/Interventions Cryotherapy;Electrical Stimulation;Gait training;Ultrasound;Moist Heat;Stair training;Functional mobility training;Therapeutic activities;Therapeutic exercise;Balance  training;Neuromuscular re-education;Manual techniques;Patient/family education;Scar mobilization;Passive range of motion;Dry needling;Taping   PT Next Visit Plan advance HEP, manual therapy   PT Home Exercise Plan initiated- see patient instructions   Consulted and Agree with Plan of Care Patient      Patient will benefit from skilled therapeutic intervention in order to improve the following deficits and impairments:  Pain, Decreased scar mobility, Decreased mobility, Decreased activity tolerance, Decreased strength, Decreased range of motion, Impaired flexibility, Increased edema, Difficulty walking, Decreased balance  Visit Diagnosis: Muscle weakness (generalized)  Difficulty in walking, not elsewhere classified     Problem List Patient Active Problem List   Diagnosis Date Noted  .  Ligament tear 03/15/2016    Trotter,Margaret PT, DPT 11/16/2016, 6:12 PM  [facility] [address] [place], [place], [address] Phone: [phone]   Fax:  [phone]  Name: Ardelia Memsnthoney R Clare Sr. MRN: [id number] Date of Birth: May 11, 1976

## 2016-11-16 NOTE — Patient Instructions (Addendum)
Stretching: Calf - Towel    Sit with knee straight and towel looped around left foot. Gently pull on towel until stretch is felt in calf. Hold ____ seconds. Repeat ____ times per set. Do ____ sets per session. Do ____ sessions per day.  http://orth.exer.us/706   Copyright  VHI. All rights reserved.   Achilles Tendon Stretch   Stand with hands supported on wall, elbows slightly bent, feet parallel and both heels on floor, front knee bent, back knee straight. Slowly relax back knee until a stretch is felt in achilles tendon. Hold _20___ seconds. Repeat with leg positions switched.  Copyright  VHI. All rights reserved.  Achilles / Soleus, Standing   Stand, right foot behind, heel on floor and turned slightly out. Lower hips and bend knees. Hold _20__ seconds. Repeat _3__ times per session. Do _2__ sessions per day.  Copyright  VHI. All rights reserved.  ANKLE: Dorsiflexion, Step Unilateral   Stand on step, hang one heel off back of step. Hold _20__ seconds. __3_ reps per set, __2_ sets per day, _5_ days per week Hold onto a support.  Copyright  VHI. All rights reserved.   Balance, Proprioception: Hip Abduction With Tubing   With tubing attached to both ankles, Standing holding onto counter, kick one leg out to side and then Return.  Repeat _10___ times  On each side.  Do ___2_ sessions per day.  http://cc.exer.us/20   Copyright  VHI. All rights reserved.  Balance, Proprioception: Hip Adduction With Tubing   With tubing tied around table leg, loop band around one ankle and try to cross leg in front. Return. Repeat _10___ times. do __2__ sessions per day.  http://cc.exer.us/21   Copyright  VHI. All rights reserved.  Balance, Proprioception: Hip Extension With Tubing   With tubing tied around both legs, holding onto kitchen counter, swing leg back. Return. Repeat _10___ times . Do __2__ sessions per day.  http://cc.exer.us/19   Copyright  VHI. All rights  reserved.  Balance, Proprioception: Hip Flexion With Tubing   With tubing attached to both ankles, swing leg forward. Return. Repeat _10___ times. Do __2__ sessions per day.  http://cc.exer.us/18   Copyright  VHI. All rights reserved.  Band Walk: Side Stepping   Tie band around legs, around ankles. Step _10__ feet to one side, then step back to start. Repeat _2-3__ feet per session. Note: Small towel between band and skin eases rubbing.  http://plyo.exer.us/76   Copyright  VHI. All rights reserved.   Band Walk: Zig Zag   Tie green band around legs, just above knees. Walk forward _both__ feet in a zig zag pattern. Without turning walk backward to start for one zig zag. Repeat _2-3__ zig zags per session.   http://plyo.exer.us/80   Copyright  VHI. All rights reserved.   Backward Walking    Walk backward, toes of each foot coming down first. Take long, even strides. Make sure you have a clear pathway with no obstructions when you do this. Repeat 5 laps in kitchen beside counter for safety;   Copyright  VHI. All rights reserved.

## 2016-11-24 ENCOUNTER — Encounter: Payer: Self-pay | Admitting: Podiatry

## 2016-11-24 ENCOUNTER — Ambulatory Visit (INDEPENDENT_AMBULATORY_CARE_PROVIDER_SITE_OTHER): Payer: Self-pay | Admitting: Podiatry

## 2016-11-24 DIAGNOSIS — Z9889 Other specified postprocedural states: Secondary | ICD-10-CM

## 2016-11-24 DIAGNOSIS — T148XXA Other injury of unspecified body region, initial encounter: Secondary | ICD-10-CM

## 2016-11-24 MED ORDER — METHYLPREDNISOLONE 4 MG PO TBPK
ORAL_TABLET | ORAL | 0 refills | Status: DC
Start: 2016-11-24 — End: 2022-10-24

## 2016-11-24 MED ORDER — OXYCODONE-ACETAMINOPHEN 10-325 MG PO TABS: 1.0000 | ORAL_TABLET | ORAL | 0 refills | Status: DC | PRN

## 2016-11-24 MED ORDER — DICLOFENAC EPOLAMINE 1.3 % TD PTCH: 1.0000 | MEDICATED_PATCH | Freq: Two times a day (BID) | TRANSDERMAL | 0 refills | Status: DC

## 2016-11-30 NOTE — Progress Notes (Signed)
  Subjective: Ardelia MemsAnthoney R Hanneman Sr. is a 40 y.o. is seen today in office s/p left lateral ankle stabilization, peroneal tendon repair. He states he has had physical therapy for 2 treatments but his insurance only proved 3 after the initial evaluation. Because of this he has not been going on ongoing basis. Return to regular shoe. He states he still gets pain to the area. Swelling is intermittent. Denies any redness or warmth. Denies any systemic complaints such as fevers, chills, nausea, vomiting. No calf pain, chest pain, shortness of breath.   Objective: General: No acute distress, AAOx3  DP/PT pulses palpable 2/4, CRT < 3 sec to all digits.  Protective sensation intact. Motor function intact.  Left ankle: Incision is well-healed the scar overlying the incision. There is no erythema or increase in warmth. There is mild tenderness on the course the ATFL on the surgical site. Minimal discomfort on the peroneal tendon. There is no other areas of pinpoint bony tenderness. Unable to elicit any clicking or popping sensation today. There is no pain with ankle or subtalar joint range of motion. No other areas of tenderness bilaterally. No other open lesions or pre-ulcerative lesions.  No pain with calf compression, swelling, warmth, erythema.   Assessment and Plan:  Status post left ankle surgery  -Treatment options discussed including all alternatives, risks, and complications -At this time he has gone from being immobilized in a CAM boot for long. At time to regular shoe without any significant physical therapy or bracing. I think that his sudden transitions causing increasing pain I recommended him to return to wearing the ankle brace as well as continue with home therapy as well as physical therapy. Discussed gradually increasing activity. -Refill pain medicine saying increases this pain medicine giving the pain level. Follow up as scheduled or sooner if needed. Call any questions or concerns  meantime.  Ovid CurdMatthew Marissa Lowrey, DPM

## 2016-12-01 ENCOUNTER — Ambulatory Visit: Payer: Medicaid Other

## 2016-12-06 ENCOUNTER — Other Ambulatory Visit: Payer: Self-pay | Admitting: *Deleted

## 2016-12-06 MED ORDER — OXYCODONE-ACETAMINOPHEN 10-325 MG PO TABS: 1.0000 | ORAL_TABLET | ORAL | 0 refills | Status: DC | PRN

## 2016-12-06 NOTE — Telephone Encounter (Signed)
OK to refill.   Also, can we put in for a pain management consult for him? He was getting Percocet before his surgery and he is still taking quite a bit.

## 2016-12-06 NOTE — Telephone Encounter (Signed)
Refill 10's or 5's

## 2016-12-08 ENCOUNTER — Encounter: Payer: Medicaid Other | Admitting: Physical Therapy

## 2016-12-12 ENCOUNTER — Ambulatory Visit: Payer: Medicaid Other | Attending: Podiatry

## 2016-12-22 ENCOUNTER — Ambulatory Visit: Payer: Medicaid Other | Admitting: Podiatry

## 2017-01-12 ENCOUNTER — Other Ambulatory Visit: Payer: Self-pay | Admitting: Orthopaedic Surgery

## 2017-01-12 DIAGNOSIS — M79672 Pain in left foot: Secondary | ICD-10-CM

## 2017-01-12 DIAGNOSIS — M7672 Peroneal tendinitis, left leg: Secondary | ICD-10-CM

## 2017-01-13 ENCOUNTER — Ambulatory Visit
Admission: RE | Admit: 2017-01-13 | Discharge: 2017-01-13 | Disposition: A | Discharge status: Home / Self Care | Payer: Medicaid Other | Source: Ambulatory Visit | Attending: Orthopaedic Surgery | Admitting: Orthopaedic Surgery

## 2017-01-13 DIAGNOSIS — M7672 Peroneal tendinitis, left leg: Secondary | ICD-10-CM | POA: Diagnosis present

## 2017-01-13 DIAGNOSIS — X58XXXA Exposure to other specified factors, initial encounter: Secondary | ICD-10-CM | POA: Diagnosis not present

## 2017-01-13 DIAGNOSIS — S96912A Strain of unspecified muscle and tendon at ankle and foot level, left foot, initial encounter: Secondary | ICD-10-CM | POA: Insufficient documentation

## 2017-01-13 DIAGNOSIS — M659 Synovitis and tenosynovitis, unspecified: Secondary | ICD-10-CM | POA: Insufficient documentation

## 2017-01-13 DIAGNOSIS — M79672 Pain in left foot: Secondary | ICD-10-CM | POA: Diagnosis present

## 2017-08-25 NOTE — Telephone Encounter (Signed)
error 

## 2017-12-10 ENCOUNTER — Emergency Department: Payer: Medicaid Other

## 2017-12-10 ENCOUNTER — Encounter: Payer: Self-pay | Admitting: Emergency Medicine

## 2017-12-10 ENCOUNTER — Emergency Department
Admission: EM | Admit: 2017-12-10 | Discharge: 2017-12-10 | Disposition: A | Discharge status: Home / Self Care | Payer: Medicaid Other | Attending: Emergency Medicine | Admitting: Emergency Medicine

## 2017-12-10 DIAGNOSIS — M545 Low back pain, unspecified: Secondary | ICD-10-CM

## 2017-12-10 DIAGNOSIS — Z79899 Other long term (current) drug therapy: Secondary | ICD-10-CM | POA: Insufficient documentation

## 2017-12-10 MED ORDER — OXYCODONE-ACETAMINOPHEN 5-325 MG PO TABS
1.0000 | ORAL_TABLET | ORAL | Status: DC | PRN
  Administered 2017-12-10: 1 via ORAL
  Filled 2017-12-10: qty 1

## 2017-12-10 MED ORDER — OXYCODONE-ACETAMINOPHEN 10-325 MG PO TABS: 1.0000 | ORAL_TABLET | ORAL | 0 refills | Status: AC | PRN

## 2017-12-10 MED ORDER — HYDROMORPHONE HCL 1 MG/ML IJ SOLN
2.0000 mg | Freq: Once | INTRAMUSCULAR | Status: AC
  Administered 2017-12-10: 2 mg via INTRAMUSCULAR
  Filled 2017-12-10: qty 2

## 2017-12-10 MED ORDER — IBUPROFEN 600 MG PO TABS
600.0000 mg | ORAL_TABLET | Freq: Once | ORAL | Status: AC
Start: 2017-12-10 — End: 2017-12-10
  Administered 2017-12-10: 600 mg via ORAL
  Filled 2017-12-10: qty 1

## 2017-12-10 NOTE — ED Triage Notes (Signed)
Patient states that he was working on a car and a jack popped out from under the car and hit him in the back. Patient with pain and swelling to the center of lower back.

## 2017-12-10 NOTE — Discharge Instructions (Addendum)
Fortunately today your x-ray was normal and you do not have any broken or dislocated bones, however you clearly do have an acute injury.  Please take your Percocet as needed for severe pain and follow-up with your primary care physician as needed.  It is normal for your pain to be most intense in the next 2 days, however it should get better quickly thereafter.  It was a pleasure to take care of you today, and thank you for coming to our emergency department.  If you have any questions or concerns before leaving please ask the nurse to grab me and I'm more than happy to go through your aftercare instructions again.  If you were prescribed any opioid pain medication today such as Norco, Vicodin, Percocet, morphine, hydrocodone, or oxycodone please make sure you do not drive when you are taking this medication as it can alter your ability to drive safely.  If you have any concerns once you are home that you are not improving or are in fact getting worse before you can make it to your follow-up appointment, please do not hesitate to call 911 and come back for further evaluation.  Merrily BrittleNeil Juanya Villavicencio, MD  Results for orders placed or performed during the hospital encounter of 04/20/15  Culture, group A strep [facility])  Result Value Ref Range   Specimen Description THROAT    Special Requests NONE    Culture      MODERATE GROWTH GROUP A STREP (S.PYOGENES) ISOLATED There is no known Penicillin Resistant Beta Streptococcus in the [place] For patients that are Penicillin-allergic, Erythromycin is 85-94% susceptible, and Clindamycin is 80% susceptible.  Contact Microbiology within 7 days if sensitivity testing is  required.      Report Status 04/22/2015 FINAL   POCT rapid strep A [facility])  Result Value Ref Range   Streptococcus, Group A Screen (Direct) NEGATIVE NEGATIVE   Dg Lumbar Spine Complete  Result Date: 12/10/2017 CLINICAL DATA:  Lumbosacral back pain and swelling after injury. Patient states  that he was working on a car and a jack popped out from under the car and hit him in the back. EXAM: LUMBAR SPINE - COMPLETE 4+ VIEW COMPARISON:  None. FINDINGS: The alignment is maintained. Vertebral body heights are normal. There is no listhesis. The posterior elements are intact. Disc spaces are preserved. No fracture. Sacroiliac joints are symmetric and normal. IMPRESSION: Negative radiographs of the lumbar spine. Electronically Signed   By: Rubye OaksMelanie  Ehinger M.D.   On: 12/10/2017 03:12

## 2017-12-10 NOTE — ED Notes (Signed)
Pt says about 30 minutes ago a jack "popped out" from under a car and hit him in his back;

## 2017-12-10 NOTE — ED Provider Notes (Signed)
[facility] Emergency Department Provider Note  ____________________________________________   First MD Initiated Contact with Patient 12/10/17 0522     (approximate)  I have reviewed the triage vital signs and the nursing notes.   HISTORY  Chief Complaint Back Pain    HPI Timothy Esterline. is a 42 y.o. male self presents to the emergency department with sudden onset severe right low back pain that began roughly an hour prior to arrival when he was working on a car and the jack slipped fell and hit him on the right low back.  The pain is worse with movement improved with rest.  He denies numbness or weakness.  He denies bowel or bladder incontinence or hesitance.  He has a long-standing history of chronic low back pain and takes Percocet 4 times a day.  He took Percocet at home which did improve his symptoms.  History reviewed. No pertinent past medical history.  Patient Active Problem List   Diagnosis Date Noted  . Ligament tear 03/15/2016    Past Surgical History:  Procedure Laterality Date  . ANKLE SURGERY Left     Prior to Admission medications   Medication Sig Start Date End Date Taking? Authorizing Provider  ALPRAZolam Prudy Feeler) 1 MG tablet Take 1 mg by mouth at bedtime as needed for anxiety.    [provider]  amphetamine-dextroamphetamine (ADDERALL XR) 30 MG 24 hr capsule Take 30 mg by mouth daily.    [provider]  desoximetasone (TOPICORT) 0.25 % cream Apply 1 application topically 2 (two) times daily. Patient not taking: Reported on 11/01/2016 09/29/16   Vivi Barrack, DPM  diclofenac (FLECTOR) 1.3 % PTCH Place 1 patch onto the skin 2 (two) times daily. 11/24/16   Vivi Barrack, DPM  ibuprofen (ADVIL,MOTRIN) 800 MG tablet Take 1 tablet (800 mg total) by mouth every 8 (eight) hours as needed for mild pain or moderate pain. Patient not taking: Reported on 11/01/2016 06/21/15   Renford Dills, NP    methylPREDNISolone (MEDROL DOSEPAK) 4 MG TBPK tablet Take as directed 11/24/16   Vivi Barrack, DPM  NONFORMULARY OR COMPOUNDED ITEM [facility]:  Antiinflammatory cream - Diclofenac 3%, Baclofen 2%, Cyclobenzaprine 2%, Lidocaine 2%, apply 102 grams to affected area 3-4 times daily. Patient not taking: Reported on 11/01/2016 08/04/16   Vivi Barrack, DPM  omeprazole (PRILOSEC) 20 MG capsule Take by mouth. 08/26/15 08/25/16  [provider]  oxyCODONE-acetaminophen (PERCOCET) 10-325 MG tablet Take 1 tablet by mouth every 4 (four) hours as needed for pain. MAXIMUM TOTAL ACETAMINOPHEN DOSE IS 4000 MG PER DAY 12/10/17   Merrily Brittle, MD    Allergies Patient has no active allergies.  No family history on file.  Social History Social History   Tobacco Use  . Smoking status: Never Smoker  . Smokeless tobacco: Never Used  Substance Use Topics  . Alcohol use: Yes    Comment: occ  . Drug use: No    Review of Systems Constitutional: No fever/chills ENT: No sore throat. Cardiovascular: Denies chest pain. Respiratory: Denies shortness of breath. Gastrointestinal: No abdominal pain.  No nausea, no vomiting.  No diarrhea.  No constipation. Musculoskeletal: Positive for back pain. Neurological: Negative for headaches   ____________________________________________   PHYSICAL EXAM:  VITAL SIGNS: ED Triage Vitals [12/10/17 0234]  Enc Vitals Group     BP (!) 152/79     Pulse Rate (!) 110     Resp 18     Temp  98.1 F (36.7 C)     Temp Source Oral     SpO2 96 %     Weight 228 lb (103.4 kg)     Height 5\' 11"  (1.803 m)     Head Circumference      Peak Flow      Pain Score 10     Pain Loc      Pain Edu?      Excl. in GC?     Constitutional: Alert and oriented x4 appears uncomfortable sitting up wincing splinting his back Head: Atraumatic. Nose: No congestion/rhinnorhea. Mouth/Throat: No trismus Neck: No stridor.   Cardiovascular: Soft  nontender Respiratory: Normal respiratory effort.  No retractions. MSK: No midline back tenderness quite tender with swelling right paraspinal lumbar Neurologic:  Normal speech and language. No gross focal neurologic deficits are appreciated.  Skin:  Skin is warm, dry and intact. No rash noted.    ____________________________________________  LABS (all labs ordered are listed, but only abnormal results are displayed)  Labs Reviewed - No data to display   __________________________________________  EKG   ____________________________________________  RADIOLOGY  Lumbar x-ray reviewed by me with no acute disease ____________________________________________   DIFFERENTIAL includes but not limited to  Compression fracture, muscular skeletal pain, sciatica   PROCEDURES  Procedure(s) performed: no  Procedures  Critical Care performed: no  Observation: no ____________________________________________   INITIAL IMPRESSION / ASSESSMENT AND PLAN / ED COURSE  Pertinent labs & imaging results that were available during my care of the patient were reviewed by me and considered in my medical decision making (see chart for details).  Fortunately the patient's x-rays negative for acute pathology and he is neurovascularly intact.  He clearly does have swelling in his right paraspinal from the significant trauma.  He is not driving so I gave him 2 mg of hydromorphone intramuscularly which significantly improved his pain.  He is a chronic pain patient and takes Percocet daily but given his acute injury I do believe he warrants an extra short course of additional Percocet.  He is discharged home in improved condition verbalized understanding agree with plan.      ____________________________________________   FINAL CLINICAL IMPRESSION(S) / ED DIAGNOSES  Final diagnoses:  Acute right-sided low back pain without sciatica      NEW MEDICATIONS STARTED DURING THIS  VISIT:  This SmartLink is deprecated. Use AVSMEDLIST instead to display the medication list for a patient.   Note:  This document was prepared using Dragon voice recognition software and may include unintentional dictation errors.      Merrily Brittleifenbark, Chidinma Clites, MD 12/10/17 [phone]

## 2018-05-28 ENCOUNTER — Other Ambulatory Visit: Payer: Self-pay | Admitting: Anesthesiology

## 2018-05-28 DIAGNOSIS — M5416 Radiculopathy, lumbar region: Secondary | ICD-10-CM

## 2018-05-28 DIAGNOSIS — G90522 Complex regional pain syndrome I of left lower limb: Secondary | ICD-10-CM

## 2018-06-14 ENCOUNTER — Ambulatory Visit
Admission: RE | Admit: 2018-06-14 | Discharge: 2018-06-14 | Disposition: A | Discharge status: Home / Self Care | Payer: Medicaid Other | Source: Ambulatory Visit | Attending: Anesthesiology | Admitting: Anesthesiology

## 2018-06-14 DIAGNOSIS — G90522 Complex regional pain syndrome I of left lower limb: Secondary | ICD-10-CM | POA: Diagnosis not present

## 2018-06-14 DIAGNOSIS — M5416 Radiculopathy, lumbar region: Secondary | ICD-10-CM | POA: Diagnosis not present

## 2020-12-09 ENCOUNTER — Emergency Department
Admission: EM | Admit: 2020-12-09 | Discharge: 2020-12-09 | Disposition: A | Discharge status: Home / Self Care | Payer: Medicaid Other | Attending: Emergency Medicine | Admitting: Emergency Medicine

## 2020-12-09 ENCOUNTER — Other Ambulatory Visit: Payer: Self-pay

## 2020-12-09 DIAGNOSIS — U071 COVID-19: Secondary | ICD-10-CM

## 2020-12-09 DIAGNOSIS — R112 Nausea with vomiting, unspecified: Secondary | ICD-10-CM | POA: Diagnosis present

## 2020-12-09 LAB — CBC
HCT: 45.8 % (ref 39.0–52.0)
Hemoglobin: 15.9 g/dL (ref 13.0–17.0)
MCH: 28.8 pg (ref 26.0–34.0)
MCHC: 34.7 g/dL (ref 30.0–36.0)
MCV: 83 fL (ref 80.0–100.0)
Platelets: 161 10*3/uL (ref 150–400)
RBC: 5.52 MIL/uL (ref 4.22–5.81)
RDW: 13.5 % (ref 11.5–15.5)
WBC: 4.9 10*3/uL (ref 4.0–10.5)
nRBC: 0 % (ref 0.0–0.2)

## 2020-12-09 LAB — COMPREHENSIVE METABOLIC PANEL
ALT: 37 U/L (ref 0–44)
AST: 38 U/L (ref 15–41)
Albumin: 3.8 g/dL (ref 3.5–5.0)
Alkaline Phosphatase: 71 U/L (ref 38–126)
Anion gap: 14 (ref 5–15)
BUN: 18 mg/dL (ref 6–20)
CO2: 26 mmol/L (ref 22–32)
Calcium: 8.9 mg/dL (ref 8.9–10.3)
Chloride: 97 mmol/L — ABNORMAL LOW (ref 98–111)
Creatinine, Ser: 0.95 mg/dL (ref 0.61–1.24)
GFR, Estimated: >60 mL/min (ref >60)
Glucose, Bld: 112 mg/dL — ABNORMAL HIGH (ref 70–99)
Potassium: 3.3 mmol/L — ABNORMAL LOW (ref 3.5–5.1)
Sodium: 137 mmol/L (ref 135–145)
Total Bilirubin: 1 mg/dL (ref 0.3–1.2)
Total Protein: 7.9 g/dL (ref 6.5–8.1)

## 2020-12-09 LAB — URINALYSIS, COMPLETE (UACMP) WITH MICROSCOPIC
Bacteria, UA: NONE SEEN
Bilirubin Urine: NEGATIVE
Glucose, UA: NEGATIVE mg/dL
Hgb urine dipstick: NEGATIVE
Ketones, ur: 20 mg/dL — AB
Leukocytes,Ua: NEGATIVE
Nitrite: NEGATIVE
Protein, ur: >=300 mg/dL — AB
Specific Gravity, Urine: 1.026 (ref 1.005–1.030)
Squamous Epithelial / LPF: NONE SEEN (ref 0–5)
pH: 6 (ref 5.0–8.0)

## 2020-12-09 LAB — LIPASE, BLOOD: Lipase: 47 U/L (ref 11–51)

## 2020-12-09 MED ORDER — ONDANSETRON 8 MG PO TBDP
8.0000 mg | ORAL_TABLET | Freq: Once | ORAL | Status: AC
  Administered 2020-12-09: 8 mg via ORAL
  Filled 2020-12-09: qty 1

## 2020-12-09 MED ORDER — SODIUM CHLORIDE 0.9 % IV BOLUS
1000.0000 mL | Freq: Once | INTRAVENOUS | Status: AC
  Administered 2020-12-09: 1000 mL via INTRAVENOUS

## 2020-12-09 MED ORDER — ONDANSETRON 8 MG PO TBDP: 8.0000 mg | ORAL_TABLET | Freq: Three times a day (TID) | ORAL | 0 refills | Status: AC | PRN

## 2020-12-09 NOTE — ED Notes (Signed)
Pt signed esignature  D/c inst to pt iv dced

## 2020-12-09 NOTE — ED Triage Notes (Signed)
Pt covid pos last Monday. Pt has n/v/d, weakness.

## 2020-12-09 NOTE — ED Provider Notes (Signed)
[facility] Emergency Department Provider Note ____________________________________________   Event Date/Time   First MD Initiated Contact with Patient 12/09/20 1345     (approximate)  I have reviewed the triage vital signs and the nursing notes.   HISTORY  Chief Complaint Emesis    HPI Timothy Mullen. is a 45 y.o. male with PMH as noted below who was diagnosed with COVID about 10 days ago and presents with nausea and vomiting over the last week, persistent course, and associated with inability to hold down any solid food.  He reports generalized weakness, crampy diffuse abdominal pain and generalized body aches, headache, and malaise.  He denies any significant cough, shortness of breath, or chest pain.  He has no diarrhea.   History reviewed. No pertinent past medical history.  Patient Active Problem List   Diagnosis Date Noted  . Ligament tear 03/15/2016    Past Surgical History:  Procedure Laterality Date  . ANKLE SURGERY Left     Prior to Admission medications   Medication Sig Start Date End Date Taking? Authorizing Provider  ondansetron (ZOFRAN ODT) 8 MG disintegrating tablet Take 1 tablet (8 mg total) by mouth every 8 (eight) hours as needed for nausea or vomiting. 12/09/20  Yes Dionne Bucy, MD  ALPRAZolam Prudy Feeler) 1 MG tablet Take 1 mg by mouth at bedtime as needed for anxiety.    [provider]  amphetamine-dextroamphetamine (ADDERALL XR) 30 MG 24 hr capsule Take 30 mg by mouth daily.    [provider]  desoximetasone (TOPICORT) 0.25 % cream Apply 1 application topically 2 (two) times daily. Patient not taking: Reported on 11/01/2016 09/29/16   Vivi Barrack, DPM  diclofenac (FLECTOR) 1.3 % PTCH Place 1 patch onto the skin 2 (two) times daily. 11/24/16   Vivi Barrack, DPM  ibuprofen (ADVIL,MOTRIN) 800 MG tablet Take 1 tablet (800 mg total) by mouth every 8 (eight) hours as needed for mild pain or  moderate pain. Patient not taking: Reported on 11/01/2016 06/21/15   Renford Dills, NP  methylPREDNISolone (MEDROL DOSEPAK) 4 MG TBPK tablet Take as directed 11/24/16   Vivi Barrack, DPM  NONFORMULARY OR COMPOUNDED ITEM [facility]:  Antiinflammatory cream - Diclofenac 3%, Baclofen 2%, Cyclobenzaprine 2%, Lidocaine 2%, apply 102 grams to affected area 3-4 times daily. Patient not taking: Reported on 11/01/2016 08/04/16   Vivi Barrack, DPM  omeprazole (PRILOSEC) 20 MG capsule Take by mouth. 08/26/15 08/25/16  [provider]  oxyCODONE-acetaminophen (PERCOCET) 10-325 MG tablet Take 1 tablet by mouth every 4 (four) hours as needed for pain. MAXIMUM TOTAL ACETAMINOPHEN DOSE IS 4000 MG PER DAY 12/10/17   Merrily Brittle, MD    Allergies Patient has no known allergies.  History reviewed. No pertinent family history.  Social History Social History   Tobacco Use  . Smoking status: Never Smoker  . Smokeless tobacco: Never Used  Substance Use Topics  . Alcohol use: Yes    Comment: occ  . Drug use: No    Review of Systems  Constitutional: No fever.  Positive for generalized weakness. Eyes: No visual changes. ENT: No sore throat. Cardiovascular: Denies chest pain. Respiratory: Denies shortness of breath. Gastrointestinal: Positive for nausea and vomiting. Genitourinary: Negative for dysuria.  Musculoskeletal: Positive for body aches Skin: Negative for rash. Neurological: Positive for headache.   ____________________________________________   PHYSICAL EXAM:  VITAL SIGNS: ED Triage Vitals  Enc Vitals Group     BP 12/09/20 1241 127/83  Pulse Rate 12/09/20 1241 (!) 106     Resp 12/09/20 1241 18     Temp 12/09/20 1241 99.1 F (37.3 C)     Temp Source 12/09/20 1241 Oral     SpO2 12/09/20 1241 97 %     Weight 12/09/20 1240 230 lb (104.3 kg)     Height 12/09/20 1240 5\' 11"  (1.803 m)     Head Circumference --      Peak Flow --      Pain Score 12/09/20  1240 9     Pain Loc --      Pain Edu? --      Excl. in GC? --     Constitutional: Alert and oriented.  Relatively well appearing and in no acute distress. Eyes: Conjunctivae are normal.  Head: Atraumatic. Nose: No congestion/rhinnorhea. Mouth/Throat: Mucous membranes are dry. Neck: Normal range of motion.  Cardiovascular: Normal rate, regular rhythm.  Good peripheral circulation. Respiratory: Normal respiratory effort.  No retractions.  Gastrointestinal: Soft and nontender. No distention.  Genitourinary: No flank tenderness. Musculoskeletal: Extremities warm and well perfused.  Neurologic:  Normal speech and language. No gross focal neurologic deficits are appreciated.  Skin:  Skin is warm and dry. No rash noted. Psychiatric: Mood and affect are normal. Speech and behavior are normal.  ____________________________________________   LABS (all labs ordered are listed, but only abnormal results are displayed)  Labs Reviewed  COMPREHENSIVE METABOLIC PANEL - Abnormal; Notable for the following components:      Result Value   Potassium 3.3 (*)    Chloride 97 (*)    Glucose, Bld 112 (*)    All other components within normal limits  URINALYSIS, COMPLETE (UACMP) WITH MICROSCOPIC - Abnormal; Notable for the following components:   Color, Urine AMBER (*)    APPearance HAZY (*)    Ketones, ur 20 (*)    Protein, ur >=300 (*)    All other components within normal limits  LIPASE, BLOOD  CBC   ____________________________________________  EKG   ____________________________________________  RADIOLOGY    ____________________________________________   PROCEDURES  Procedure(s) performed: No  Procedures  Critical Care performed: No ____________________________________________   INITIAL IMPRESSION / ASSESSMENT AND PLAN / ED COURSE  Pertinent labs & imaging results that were available during my care of the patient were reviewed by me and considered in my medical decision  making (see chart for details).  45 year old male with PMH as noted above and status post recent diagnosis of COVID-19 10 days ago presents with 1 week of persistent nausea and vomiting with decreased p.o. intake and generalized weakness.  The vomitus recently became slightly blood-tinged.  He reports diffuse body aches including some abdominal soreness but no focal abdominal pain or tenderness.  On exam, the patient is overall well-appearing.  His vital signs are normal except for borderline elevated temperature and slight tachycardia.  The abdomen is soft with no focal tenderness.  Overall presentation is consistent with symptoms related to COVID-19 versus other viral etiology of gastroenteritis or gastritis.  The blood-tinged vomitus is consistent with a mild Mallory-Weiss tear or gastritis.  Reassuringly, the patient is not having any significant respiratory symptoms or requiring any oxygen.  Initial lab work-up is also reassuring, with borderline low potassium and chloride but no other acute abnormalities.  Hemoglobin is 15.9.  We will give fluids and antiemetic and reassess.  Anticipate discharge home.  ----------------------------------------- 4:51 PM on 12/09/2020 -----------------------------------------  The patient is feeling better after fluids.  He is tolerating  p.o.  Urinalysis shows some protein which is of unclear significance, but there is no evidence of acute renal injury.  Creatinine is normal.  The patient is stable for discharge home.  I counseled him on the results of the work-up.  I will prescribe ODT Zofran.  Return precautions given, and he expresses understanding.  _________________________  Timothy Pugh Sr. was evaluated in Emergency Department on 12/09/2020 for the symptoms described in the history of present illness. He was evaluated in the context of the global COVID-19 pandemic, which necessitated consideration that the patient might be at risk for infection  with the SARS-CoV-2 virus that causes COVID-19. Institutional protocols and algorithms that pertain to the evaluation of patients at risk for COVID-19 are in a state of rapid change based on information released by regulatory bodies including the CDC and federal and state organizations. These policies and algorithms were followed during the patient's care in the ED.  ____________________________________________   FINAL CLINICAL IMPRESSION(S) / ED DIAGNOSES  Final diagnoses:  COVID-19  Non-intractable vomiting with nausea, unspecified vomiting type      NEW MEDICATIONS STARTED DURING THIS VISIT:  New Prescriptions   ONDANSETRON (ZOFRAN ODT) 8 MG DISINTEGRATING TABLET    Take 1 tablet (8 mg total) by mouth every 8 (eight) hours as needed for nausea or vomiting.     Note:  This document was prepared using Dragon voice recognition software and may include unintentional dictation errors.    Arta Silence, MD 12/09/20 1651

## 2020-12-09 NOTE — ED Notes (Signed)
First RN note:  Pt comes int the ED via [facility] c/o COVID positive.  Tested on Monday.  Pt states he has N/V and cant sleep.  Pt hypertensive with EMS.

## 2020-12-09 NOTE — Discharge Instructions (Signed)
Your urinalysis shows some protein in the urine.  This can be related to inflammation.  Your urine should be rechecked in a few weeks to see if this is persistent.  You should follow-up with your primary care doctor.  Take the Zofran as needed for nausea.  Return to the ER for new, worsening, or persistent severe nausea or vomiting, abdominal pain, blood in the vomit or stool, fevers, weakness, or any other new or worsening symptoms that concern you.

## 2022-02-08 ENCOUNTER — Emergency Department: Payer: Medicaid Other

## 2022-02-08 ENCOUNTER — Encounter: Payer: Self-pay | Admitting: Emergency Medicine

## 2022-02-08 ENCOUNTER — Emergency Department
Admission: EM | Admit: 2022-02-08 | Discharge: 2022-02-08 | Disposition: A | Discharge status: Home / Self Care | Payer: Medicaid Other | Attending: Emergency Medicine | Admitting: Emergency Medicine

## 2022-02-08 ENCOUNTER — Other Ambulatory Visit: Payer: Self-pay

## 2022-02-08 DIAGNOSIS — R519 Headache, unspecified: Secondary | ICD-10-CM | POA: Diagnosis not present

## 2022-02-08 DIAGNOSIS — Y92410 Unspecified street and highway as the place of occurrence of the external cause: Secondary | ICD-10-CM | POA: Diagnosis not present

## 2022-02-08 DIAGNOSIS — S5002XA Contusion of left elbow, initial encounter: Secondary | ICD-10-CM | POA: Diagnosis not present

## 2022-02-08 DIAGNOSIS — S8001XA Contusion of right knee, initial encounter: Secondary | ICD-10-CM | POA: Diagnosis not present

## 2022-02-08 DIAGNOSIS — E8889 Other specified metabolic disorders: Secondary | ICD-10-CM | POA: Insufficient documentation

## 2022-02-08 DIAGNOSIS — S301XXA Contusion of abdominal wall, initial encounter: Secondary | ICD-10-CM | POA: Insufficient documentation

## 2022-02-08 DIAGNOSIS — T07XXXA Unspecified multiple injuries, initial encounter: Secondary | ICD-10-CM

## 2022-02-08 DIAGNOSIS — S59902A Unspecified injury of left elbow, initial encounter: Secondary | ICD-10-CM | POA: Diagnosis present

## 2022-02-08 LAB — CBC WITH DIFFERENTIAL/PLATELET
Abs Immature Granulocytes: 0.03 10*3/uL (ref 0.00–0.07)
Basophils Absolute: 0.1 10*3/uL (ref 0.0–0.1)
Basophils Relative: 1 %
Eosinophils Absolute: 0.1 10*3/uL (ref 0.0–0.5)
Eosinophils Relative: 0 %
HCT: 45.9 % (ref 39.0–52.0)
Hemoglobin: 15.1 g/dL (ref 13.0–17.0)
Immature Granulocytes: 0 %
Lymphocytes Relative: 13 %
Lymphs Abs: 1.4 10*3/uL (ref 0.7–4.0)
MCH: 28.2 pg (ref 26.0–34.0)
MCHC: 32.9 g/dL (ref 30.0–36.0)
MCV: 85.8 fL (ref 80.0–100.0)
Monocytes Absolute: 0.6 10*3/uL (ref 0.1–1.0)
Monocytes Relative: 5 %
Neutro Abs: 9.3 10*3/uL — ABNORMAL HIGH (ref 1.7–7.7)
Neutrophils Relative %: 81 %
Platelets: 262 10*3/uL (ref 150–400)
RBC: 5.35 MIL/uL (ref 4.22–5.81)
RDW: 14.1 % (ref 11.5–15.5)
WBC: 11.5 10*3/uL — ABNORMAL HIGH (ref 4.0–10.5)
nRBC: 0 % (ref 0.0–0.2)

## 2022-02-08 LAB — BASIC METABOLIC PANEL
Anion gap: 9 (ref 5–15)
BUN: 12 mg/dL (ref 6–20)
CO2: 26 mmol/L (ref 22–32)
Calcium: 9.1 mg/dL (ref 8.9–10.3)
Chloride: 103 mmol/L (ref 98–111)
Creatinine, Ser: 1.11 mg/dL (ref 0.61–1.24)
GFR, Estimated: >60 mL/min (ref >60)
Glucose, Bld: 95 mg/dL (ref 70–99)
Potassium: 3.9 mmol/L (ref 3.5–5.1)
Sodium: 138 mmol/L (ref 135–145)

## 2022-02-08 MED ORDER — OXYCODONE-ACETAMINOPHEN 5-325 MG PO TABS
1.0000 | ORAL_TABLET | Freq: Once | ORAL | Status: AC
  Administered 2022-02-08: 1 via ORAL
  Filled 2022-02-08: qty 1

## 2022-02-08 MED ORDER — IOHEXOL 350 MG/ML SOLN
100.0000 mL | Freq: Once | INTRAVENOUS | Status: AC | PRN
  Administered 2022-02-08: 100 mL via INTRAVENOUS
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
## 2022-02-08 NOTE — Discharge Instructions (Signed)
Your CT of your Abdomen showed: ?1. No acute intracranial CT findings or depressed skull fractures. ?2. Ethmoid sinus disease with reverse S shaped nasal septum. ?3. No evidence of cervical fractures or malalignment. Early ?degenerative change. ?4. No acute findings in the abdomen and pelvis or regional skeletal ?fractures. ?5. Mildly steatotic liver. ?6. 6 cm lipoma anterior upper right thigh. ?

## 2022-08-04 ENCOUNTER — Other Ambulatory Visit: Payer: Self-pay

## 2022-08-04 ENCOUNTER — Emergency Department
Admission: EM | Admit: 2022-08-04 | Discharge: 2022-08-05 | Disposition: A | Discharge status: Home / Self Care | Payer: Medicaid Other | Attending: Emergency Medicine | Admitting: Emergency Medicine

## 2022-08-04 ENCOUNTER — Emergency Department: Payer: Medicaid Other

## 2022-08-04 DIAGNOSIS — W01198A Fall on same level from slipping, tripping and stumbling with subsequent striking against other object, initial encounter: Secondary | ICD-10-CM | POA: Insufficient documentation

## 2022-08-04 DIAGNOSIS — W19XXXA Unspecified fall, initial encounter: Secondary | ICD-10-CM

## 2022-08-04 DIAGNOSIS — S63502A Unspecified sprain of left wrist, initial encounter: Secondary | ICD-10-CM | POA: Insufficient documentation

## 2022-08-04 DIAGNOSIS — M25532 Pain in left wrist: Secondary | ICD-10-CM

## 2022-08-04 DIAGNOSIS — S6992XA Unspecified injury of left wrist, hand and finger(s), initial encounter: Secondary | ICD-10-CM | POA: Diagnosis present

## 2022-08-04 NOTE — ED Triage Notes (Signed)
Ambulatory to triage with c/o left wrist pain. States he landed on wrist yesterday, +CMS to extremity. Pt reports feeling tingling in ring finger and pinky finger.

## 2022-08-04 NOTE — ED Provider Notes (Signed)
[facility] Provider Note    Event Date/Time   First MD Initiated Contact with Patient 08/04/22 2347     (approximate)   History   Wrist Pain   HPI  Timothy Mullen. is a 46 y.o. male who presents to the ED for evaluation of Wrist Pain   I reviewed pain clinic visit from 4/22.  History of chronic pain syndrome, complex regional pain syndrome, chronic lower back pain.  Chronic opiates with Percocet 10.   Patient presents to the ED, accompanied by his son, for evaluation of left wrist pain after a fall that occurred yesterday.  He is left-hand dominant.  He reports accidentally tripping and falling with his wrists flexed and he struck the dorsum of his wrists bilaterally.  No head injury or syncope.  Reports dorsal and ulnar left wrist pain since that time yesterday.  Reports he is concerned that it might be fractured and wants to get checked out because of the persistent pain.   Physical Exam   Triage Vital Signs: ED Triage Vitals [08/04/22 2102]  Enc Vitals Group     BP (!) 141/92     Pulse Rate 91     Resp 18     Temp 97.8 F (36.6 C)     Temp Source Oral     SpO2 99 %     Weight 165 lb (74.8 kg)     Height 5\' 11"  (1.803 m)     Head Circumference      Peak Flow      Pain Score 4     Pain Loc      Pain Edu?      Excl. in GC?     Most recent vital signs: Vitals:   08/04/22 2102 08/05/22 0011  BP: (!) 141/92 134/64  Pulse: 91 78  Resp: 18 18  Temp: 97.8 F (36.6 C) 98.2 F (36.8 C)  SpO2: 99% 99%    General: Awake, no distress.  CV:  Good peripheral perfusion.  Resp:  Normal effort.  Abd:  No distention.  MSK:  Mild soft tissue swelling globally around the left wrist without overlying signs of trauma such as bruising, abrasions or lacerations.  Hand and fingers are distally neurovascularly intact.  He is tender to the ulnar and dorsal aspect of the wrist.  Passive range of motion intact and causes some pain with  dorsiflexion. Neuro:  No focal deficits appreciated. Other:     ED Results / Procedures / Treatments   Labs (all labs ordered are listed, but only abnormal results are displayed) Labs Reviewed - No data to display  EKG   RADIOLOGY Plain film of the left wrist interpreted by me without evidence of acute fracture or dislocation.  Official radiology report(s): DG Wrist Complete Left  Result Date: 08/04/2022 CLINICAL DATA:  Fall, left wrist injury EXAM: LEFT WRIST - COMPLETE 3+ VIEW COMPARISON:  None Available. FINDINGS: There is no evidence of fracture or dislocation. There is no evidence of arthropathy or other focal bone abnormality. Soft tissues are unremarkable. IMPRESSION: Negative. Electronically Signed   By: 08/06/2022 M.D.   On: 08/04/2022 21:30    PROCEDURES and INTERVENTIONS:  Procedures  Medications - No data to display   IMPRESSION / MDM / ASSESSMENT AND PLAN / ED COURSE  I reviewed the triage vital signs and the nursing notes.  Differential diagnosis includes, but is not limited to, sprain, fracture, dislocation  46 year old male with chronic  pain syndrome presents to the ED with acute left wrist pain after a mechanical fall causing a strain to this left wrist suitable for outpatient management.  He look systemically well.  Mild soft tissue swelling and tenderness consistent with a strain without signs of more severe pathology.  X-ray without evidence of fracture or dislocation.  We will provide a wrist Velcro splint, discussed RICE therapies, following up with his pain management clinic and return precautions for the ED.     FINAL CLINICAL IMPRESSION(S) / ED DIAGNOSES   Final diagnoses:  Sprain of left wrist, initial encounter  Fall, initial encounter  Acute pain of left wrist     Rx / DC Orders   ED Discharge Orders     None        Note:  This document was prepared using Dragon voice recognition software and may include unintentional dictation  errors.   Delton Prairie, MD 08/05/22 Jacinta Shoe

## 2022-08-05 NOTE — ED Notes (Signed)
Pt dc to home. Instructions reviewed with all questions answered. Pt verbalizes understanding. Pt ambulatory out of dept with steady gait

## 2022-09-05 ENCOUNTER — Other Ambulatory Visit: Payer: Self-pay | Admitting: Nurse Practitioner

## 2022-09-05 DIAGNOSIS — R221 Localized swelling, mass and lump, neck: Secondary | ICD-10-CM

## 2022-09-13 ENCOUNTER — Other Ambulatory Visit: Payer: Medicaid Other

## 2022-09-20 ENCOUNTER — Other Ambulatory Visit: Payer: Medicaid Other

## 2022-10-24 ENCOUNTER — Other Ambulatory Visit: Payer: Self-pay

## 2022-10-24 ENCOUNTER — Encounter (HOSPITAL_BASED_OUTPATIENT_CLINIC_OR_DEPARTMENT_OTHER): Payer: Self-pay | Admitting: Orthopedic Surgery

## 2022-11-02 ENCOUNTER — Ambulatory Visit (HOSPITAL_BASED_OUTPATIENT_CLINIC_OR_DEPARTMENT_OTHER): Admission: RE | Admit: 2022-11-02 | Payer: Medicaid Other | Source: Home / Self Care | Admitting: Orthopedic Surgery

## 2022-11-02 DIAGNOSIS — I10 Essential (primary) hypertension: Secondary | ICD-10-CM

## 2022-11-02 HISTORY — DX: Essential (primary) hypertension: I10

## 2022-11-02 HISTORY — DX: Other cervical disc degeneration, unspecified cervical region: M50.30

## 2022-11-02 HISTORY — DX: Other chronic pain: G89.29

## 2022-11-02 HISTORY — DX: Attention-deficit hyperactivity disorder, unspecified type: F90.9

## 2022-11-02 SURGERY — WRIST ARTHROSCOPY WITH FOVEAL TRIANGULAR FIBROCARTILAGE COMPLEX REPAIR
Anesthesia: General | Laterality: Left

## 2023-02-03 ENCOUNTER — Other Ambulatory Visit: Payer: Self-pay | Admitting: Nurse Practitioner

## 2023-02-03 DIAGNOSIS — R220 Localized swelling, mass and lump, head: Secondary | ICD-10-CM

## 2023-04-24 ENCOUNTER — Emergency Department
Admission: EM | Admit: 2023-04-24 | Discharge: 2023-04-24 | Disposition: A | Discharge status: Home / Self Care | Payer: Medicaid Other | Attending: Emergency Medicine | Admitting: Emergency Medicine

## 2023-04-24 ENCOUNTER — Emergency Department: Payer: Medicaid Other

## 2023-04-24 DIAGNOSIS — R519 Headache, unspecified: Secondary | ICD-10-CM | POA: Diagnosis present

## 2023-04-24 DIAGNOSIS — W1789XA Other fall from one level to another, initial encounter: Secondary | ICD-10-CM | POA: Insufficient documentation

## 2023-04-24 DIAGNOSIS — S060X9A Concussion with loss of consciousness of unspecified duration, initial encounter: Secondary | ICD-10-CM | POA: Diagnosis not present

## 2023-04-24 LAB — BASIC METABOLIC PANEL
Anion gap: 8 (ref 5–15)
BUN: 22 mg/dL — ABNORMAL HIGH (ref 6–20)
CO2: 24 mmol/L (ref 22–32)
Calcium: 8.9 mg/dL (ref 8.9–10.3)
Chloride: 105 mmol/L (ref 98–111)
Creatinine, Ser: 1.25 mg/dL — ABNORMAL HIGH (ref 0.61–1.24)
GFR, Estimated: >60 mL/min (ref >60)
Glucose, Bld: 101 mg/dL — ABNORMAL HIGH (ref 70–99)
Potassium: 3.9 mmol/L (ref 3.5–5.1)
Sodium: 137 mmol/L (ref 135–145)

## 2023-04-24 LAB — CBC
HCT: 46.4 % (ref 39.0–52.0)
Hemoglobin: 15.5 g/dL (ref 13.0–17.0)
MCH: 28.5 pg (ref 26.0–34.0)
MCHC: 33.4 g/dL (ref 30.0–36.0)
MCV: 85.5 fL (ref 80.0–100.0)
Platelets: 246 10*3/uL (ref 150–400)
RBC: 5.43 MIL/uL (ref 4.22–5.81)
RDW: 13.5 % (ref 11.5–15.5)
WBC: 7.2 10*3/uL (ref 4.0–10.5)
nRBC: 0 % (ref 0.0–0.2)

## 2023-04-24 MED ORDER — MECLIZINE HCL 50 MG PO TABS: 50.0000 mg | ORAL_TABLET | Freq: Three times a day (TID) | ORAL | 1 refills | Status: AC | PRN

## 2023-04-24 NOTE — ED Notes (Signed)
Dc instructions and scripts reviewed with pt no questions or concerns at this time. Will follow up. Pt declined wheelchair and ambulated out
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
## 2023-04-24 NOTE — ED Triage Notes (Addendum)
Pt sts that he fell off a ladder at 10 feet last week with positive LOC. Since than pt has been having vertigo like s/s.

## 2023-08-28 ENCOUNTER — Ambulatory Visit: Payer: Medicaid Other | Admitting: Psychiatry

## 2023-10-18 ENCOUNTER — Ambulatory Visit: Payer: Medicaid Other | Admitting: Psychiatry
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
## 2024-01-02 ENCOUNTER — Ambulatory Visit: Payer: Medicaid Other | Admitting: Psychiatry

## 2024-01-09 ENCOUNTER — Ambulatory Visit: Payer: Self-pay | Admitting: Psychiatry

## 2024-08-26 ENCOUNTER — Other Ambulatory Visit: Payer: Self-pay | Admitting: Physician Assistant

## 2024-08-26 DIAGNOSIS — R599 Enlarged lymph nodes, unspecified: Secondary | ICD-10-CM

## 2024-10-10 LAB — COLOGUARD: COLOGUARD: NEGATIVE

## 2024-12-09 ENCOUNTER — Ambulatory Visit (INDEPENDENT_AMBULATORY_CARE_PROVIDER_SITE_OTHER)

## 2024-12-09 ENCOUNTER — Ambulatory Visit: Admitting: Podiatry

## 2024-12-09 VITALS — Ht 71.0 in | Wt 223.0 lb

## 2024-12-09 DIAGNOSIS — M79672 Pain in left foot: Secondary | ICD-10-CM

## 2024-12-09 DIAGNOSIS — M2042 Other hammer toe(s) (acquired), left foot: Secondary | ICD-10-CM

## 2024-12-09 NOTE — Patient Instructions (Signed)

## 2024-12-11 ENCOUNTER — Telehealth: Payer: Self-pay | Admitting: Lab

## 2024-12-11 MED ORDER — FLUOCINONIDE 0.05 % EX CREA: 1.0000 | TOPICAL_CREAM | Freq: Two times a day (BID) | CUTANEOUS | 0 refills | Status: AC

## 2024-12-11 NOTE — Addendum Note (Signed)
 Addended byBETHA MEDICINE, Verdine Grenfell R on: 12/11/2024 10:14 AM   Modules accepted: Orders

## 2024-12-11 NOTE — Addendum Note (Signed)
 Addended byBETHA MEDICINE, Nia Nathaniel R on: 12/11/2024 01:57 PM   Modules accepted: Orders

## 2024-12-11 NOTE — Telephone Encounter (Signed)
 Patient calling for prescription that was suppose to be called in and nothing received please advise.

## 2024-12-11 NOTE — Progress Notes (Signed)
"  °  Subjective:  Patient ID: Timothy JONELLE Arzella Chrystal., male    DOB: [birth year],  MRN: [id number]  Chief Complaint  Patient presents with   Foot Pain    RM 7 Patient is here for left foot pain. Pt states a knot on the left 2nd toe, knot has been present for more than 1 year. Pt states pain and tenderness after prolonged standing and walking. Pt states using gel toe covers and split socks with no relief.    49 y.o. male presents with the above complaint. History confirmed with patient.  Toe spacers pads and shoe changes have not helped.  Objective:  Physical Exam: warm, good capillary refill, no trophic changes or ulcerative lesions, normal DP and PT pulses, normal sensory exam, and on his left foot he has hallux valgus warring that is mildly hallux about the second toe there is a painful callus and joint on the medial second DIPJ.   Radiographs: Multiple views x-ray of the left foot: Hallux valgus deformity, deviation of the second toe joint with deformity of the middle phalanx Assessment:   1. Left foot pain      Plan:  Patient was evaluated and treated and all questions answered.  We discussed etiology and treatment options of his toe deformity of the left second toe that is causing this we discussed how this relates to hallux valgus deformity and the hallux abutting the second toe.  We discussed the bone deformity has is contributing to this and causing the majority of the painful area.  I discussed that arthroplasty of the joint likely would offer some relief and correction of the deformity.  We also discussed the hallux valgus abutting the second toe can contribute to this as well and may long-term need hallux valgus correction as well.  We discussed the risk benefits and recovery times of both of the surgical options and at this point he would prefer to just correct the second toe and if we need to deal with the first ray at a later date then we will correct this as needed.  All questions  addressed.  Informed this I reviewed.  We discussed the risk benefits and potential complications including but not limited to pain, swelling, infection, scar, numbness which may be temporary or permanent, chronic pain, stiffness, nerve pain or damage, wound healing problems, bone healing problems including delayed or non-union.  He understand and wishes to proceed.  Informed sent signed and reviewed.   Surgical plan:  Procedure: - DIPJ arthroplasty middle phalanx head left second toe  Location: - [facility]  Anesthesia plan: - Sedation with local  Postoperative pain plan: -  Tylenol  1000 mg every 6 hours, ibuprofen  600 mg every 6 hours, tramadol 50 mg nightly  DVT prophylaxis: - None required  WB Restrictions / DME needs: - WBAT in surgical shoe postop    No follow-ups on file.   "
